# Patient Record
Sex: Male | Born: 1935
Health system: Southern US, Community
[De-identification: ages and names within clinical notes are randomized; demographics above are authoritative.]

## PROBLEM LIST (undated history)

## (undated) DIAGNOSIS — N200 Calculus of kidney: Secondary | ICD-10-CM

## (undated) DIAGNOSIS — K5790 Diverticulosis of intestine, part unspecified, without perforation or abscess without bleeding: Secondary | ICD-10-CM

## (undated) DIAGNOSIS — Z7902 Long term (current) use of antithrombotics/antiplatelets: Secondary | ICD-10-CM

## (undated) DIAGNOSIS — M5030 Other cervical disc degeneration, unspecified cervical region: Secondary | ICD-10-CM

## (undated) DIAGNOSIS — J302 Other seasonal allergic rhinitis: Secondary | ICD-10-CM

## (undated) DIAGNOSIS — E279 Disorder of adrenal gland, unspecified: Secondary | ICD-10-CM

## (undated) DIAGNOSIS — I639 Cerebral infarction, unspecified: Secondary | ICD-10-CM

## (undated) DIAGNOSIS — C61 Malignant neoplasm of prostate: Secondary | ICD-10-CM

## (undated) DIAGNOSIS — M51369 Other intervertebral disc degeneration, lumbar region without mention of lumbar back pain or lower extremity pain: Secondary | ICD-10-CM

## (undated) DIAGNOSIS — I700 Atherosclerosis of aorta: Secondary | ICD-10-CM

## (undated) DIAGNOSIS — I350 Nonrheumatic aortic (valve) stenosis: Secondary | ICD-10-CM

## (undated) DIAGNOSIS — R972 Elevated prostate specific antigen [PSA]: Secondary | ICD-10-CM

## (undated) DIAGNOSIS — M4310 Spondylolisthesis, site unspecified: Secondary | ICD-10-CM

## (undated) DIAGNOSIS — I6781 Acute cerebrovascular insufficiency: Secondary | ICD-10-CM

## (undated) DIAGNOSIS — I651 Occlusion and stenosis of basilar artery: Secondary | ICD-10-CM

## (undated) DIAGNOSIS — I10 Essential (primary) hypertension: Secondary | ICD-10-CM

## (undated) DIAGNOSIS — K4030 Unilateral inguinal hernia, with obstruction, without gangrene, not specified as recurrent: Secondary | ICD-10-CM

## (undated) DIAGNOSIS — I4510 Unspecified right bundle-branch block: Secondary | ICD-10-CM

## (undated) DIAGNOSIS — N281 Cyst of kidney, acquired: Secondary | ICD-10-CM

## (undated) DIAGNOSIS — Z87442 Personal history of urinary calculi: Secondary | ICD-10-CM

## (undated) DIAGNOSIS — R600 Localized edema: Secondary | ICD-10-CM

## (undated) DIAGNOSIS — E785 Hyperlipidemia, unspecified: Secondary | ICD-10-CM

## (undated) DIAGNOSIS — I499 Cardiac arrhythmia, unspecified: Secondary | ICD-10-CM

## (undated) HISTORY — DX: Malignant neoplasm of prostate: C61

## (undated) HISTORY — DX: Elevated prostate specific antigen (PSA): R97.20

## (undated) HISTORY — DX: Hyperlipidemia, unspecified: E78.5

## (undated) HISTORY — PX: INGUINAL HERNIA REPAIR: SHX194

## (undated) HISTORY — PX: OTHER SURGICAL HISTORY: SHX169

## (undated) HISTORY — DX: Localized edema: R60.0

## (undated) HISTORY — DX: Cerebral infarction, unspecified: I63.9

## (undated) HISTORY — DX: Calculus of kidney: N20.0

---

## 1979-05-02 HISTORY — PX: INGUINAL HERNIA REPAIR: SHX194

## 2008-08-01 ENCOUNTER — Ambulatory Visit: Payer: Self-pay | Admitting: Internal Medicine

## 2012-03-31 DIAGNOSIS — Z8673 Personal history of transient ischemic attack (TIA), and cerebral infarction without residual deficits: Secondary | ICD-10-CM | POA: Insufficient documentation

## 2012-04-23 DIAGNOSIS — I6990 Unspecified sequelae of unspecified cerebrovascular disease: Secondary | ICD-10-CM | POA: Insufficient documentation

## 2012-04-24 ENCOUNTER — Inpatient Hospital Stay: Payer: Self-pay | Admitting: Internal Medicine

## 2012-04-24 DIAGNOSIS — I63543 Cerebral infarction due to unspecified occlusion or stenosis of bilateral cerebellar arteries: Secondary | ICD-10-CM

## 2012-04-24 HISTORY — DX: Cerebral infarction due to unspecified occlusion or stenosis of bilateral cerebellar arteries: I63.543

## 2012-04-24 LAB — COMPREHENSIVE METABOLIC PANEL
Albumin: 3.6 g/dL (ref 3.4–5.0)
Alkaline Phosphatase: 90 U/L (ref 50–136)
BUN: 16 mg/dL (ref 7–18)
Bilirubin,Total: 0.5 mg/dL (ref 0.2–1.0)
Co2: 26 mmol/L (ref 21–32)
Creatinine: 0.81 mg/dL (ref 0.60–1.30)
EGFR (Non-African Amer.): >60
Glucose: 108 mg/dL — ABNORMAL HIGH (ref 65–99)
SGOT(AST): 24 U/L (ref 15–37)
SGPT (ALT): 24 U/L (ref 12–78)
Total Protein: 7.4 g/dL (ref 6.4–8.2)

## 2012-04-24 LAB — URINALYSIS, COMPLETE
Bilirubin,UR: NEGATIVE
Glucose,UR: NEGATIVE mg/dL (ref 0–75)
Hyaline Cast: 1
Nitrite: NEGATIVE
Ph: 6 (ref 4.5–8.0)
Protein: NEGATIVE
RBC,UR: <1 /HPF (ref 0–5)

## 2012-04-24 LAB — CBC
HGB: 16.2 g/dL (ref 13.0–18.0)
MCV: 96 fL (ref 80–100)
Platelet: 146 10*3/uL — ABNORMAL LOW (ref 150–440)
RDW: 13.3 % (ref 11.5–14.5)
WBC: 4.7 10*3/uL (ref 3.8–10.6)

## 2012-04-24 LAB — CK TOTAL AND CKMB (NOT AT ARMC): CK, Total: 60 U/L (ref 35–232)

## 2012-04-24 LAB — TROPONIN I: Troponin-I: <0.02 ng/mL

## 2012-04-25 LAB — CBC WITH DIFFERENTIAL/PLATELET
Basophil %: 0.4 %
Eosinophil #: 0.1 10*3/uL (ref 0.0–0.7)
Eosinophil %: 1.4 %
Lymphocyte %: 36.2 %
MCH: 32.6 pg (ref 26.0–34.0)
MCHC: 33.8 g/dL (ref 32.0–36.0)
Monocyte #: 0.4 x10 3/mm (ref 0.2–1.0)
Neutrophil %: 54.9 %
Platelet: 138 10*3/uL — ABNORMAL LOW (ref 150–440)
RBC: 4.68 10*6/uL (ref 4.40–5.90)

## 2012-04-25 LAB — COMPREHENSIVE METABOLIC PANEL
Anion Gap: 9 (ref 7–16)
BUN: 18 mg/dL (ref 7–18)
Bilirubin,Total: 0.5 mg/dL (ref 0.2–1.0)
Chloride: 107 mmol/L (ref 98–107)
Co2: 25 mmol/L (ref 21–32)
EGFR (African American): >60
Glucose: 89 mg/dL (ref 65–99)
Potassium: 4.4 mmol/L (ref 3.5–5.1)
SGOT(AST): 22 U/L (ref 15–37)
Sodium: 141 mmol/L (ref 136–145)
Total Protein: 6.5 g/dL (ref 6.4–8.2)

## 2012-04-25 LAB — LIPID PANEL
HDL Cholesterol: 30 mg/dL — ABNORMAL LOW (ref 40–60)
Triglycerides: 95 mg/dL (ref 0–200)

## 2013-08-12 DIAGNOSIS — E785 Hyperlipidemia, unspecified: Secondary | ICD-10-CM | POA: Diagnosis not present

## 2013-08-12 DIAGNOSIS — R609 Edema, unspecified: Secondary | ICD-10-CM | POA: Diagnosis not present

## 2013-08-12 DIAGNOSIS — Z125 Encounter for screening for malignant neoplasm of prostate: Secondary | ICD-10-CM | POA: Diagnosis not present

## 2013-08-12 DIAGNOSIS — Z0000 Encounter for general adult medical examination without abnormal findings: Secondary | ICD-10-CM | POA: Diagnosis not present

## 2013-08-12 DIAGNOSIS — Z79899 Other long term (current) drug therapy: Secondary | ICD-10-CM | POA: Diagnosis not present

## 2013-08-12 DIAGNOSIS — I6350 Cerebral infarction due to unspecified occlusion or stenosis of unspecified cerebral artery: Secondary | ICD-10-CM | POA: Diagnosis not present

## 2013-08-12 LAB — PSA: PSA: 12.6

## 2013-08-12 LAB — LIPID PANEL
Cholesterol: 102 mg/dL (ref 0–200)
HDL: 41 mg/dL (ref 35–70)
LDL Cholesterol: 48 mg/dL
TRIGLYCERIDES: 63 mg/dL (ref 40–160)

## 2013-08-12 LAB — TSH: TSH: 1.25 u[IU]/mL (ref <=5.90)

## 2013-08-19 DIAGNOSIS — R972 Elevated prostate specific antigen [PSA]: Secondary | ICD-10-CM | POA: Diagnosis not present

## 2013-10-29 DIAGNOSIS — R972 Elevated prostate specific antigen [PSA]: Secondary | ICD-10-CM | POA: Diagnosis not present

## 2013-10-29 DIAGNOSIS — N423 Unspecified dysplasia of prostate: Secondary | ICD-10-CM | POA: Diagnosis not present

## 2013-10-29 DIAGNOSIS — C61 Malignant neoplasm of prostate: Secondary | ICD-10-CM | POA: Diagnosis not present

## 2013-11-05 DIAGNOSIS — C61 Malignant neoplasm of prostate: Secondary | ICD-10-CM | POA: Diagnosis not present

## 2013-11-12 ENCOUNTER — Ambulatory Visit: Payer: Self-pay | Admitting: Radiation Oncology

## 2013-11-12 DIAGNOSIS — C61 Malignant neoplasm of prostate: Secondary | ICD-10-CM | POA: Diagnosis not present

## 2013-11-12 DIAGNOSIS — R948 Abnormal results of function studies of other organs and systems: Secondary | ICD-10-CM | POA: Diagnosis not present

## 2013-11-14 DIAGNOSIS — C61 Malignant neoplasm of prostate: Secondary | ICD-10-CM | POA: Diagnosis not present

## 2013-11-27 DIAGNOSIS — R0989 Other specified symptoms and signs involving the circulatory and respiratory systems: Secondary | ICD-10-CM | POA: Diagnosis not present

## 2013-11-29 ENCOUNTER — Ambulatory Visit: Payer: Self-pay | Admitting: Radiation Oncology

## 2013-12-04 DIAGNOSIS — C61 Malignant neoplasm of prostate: Secondary | ICD-10-CM | POA: Diagnosis not present

## 2013-12-04 DIAGNOSIS — R300 Dysuria: Secondary | ICD-10-CM | POA: Diagnosis not present

## 2014-01-02 DIAGNOSIS — C61 Malignant neoplasm of prostate: Secondary | ICD-10-CM | POA: Diagnosis not present

## 2014-01-06 DIAGNOSIS — C61 Malignant neoplasm of prostate: Secondary | ICD-10-CM | POA: Diagnosis not present

## 2014-01-06 DIAGNOSIS — R300 Dysuria: Secondary | ICD-10-CM | POA: Diagnosis not present

## 2014-01-12 ENCOUNTER — Ambulatory Visit: Payer: Self-pay | Admitting: Radiation Oncology

## 2014-01-12 DIAGNOSIS — Z510 Encounter for antineoplastic radiation therapy: Secondary | ICD-10-CM | POA: Diagnosis not present

## 2014-01-12 DIAGNOSIS — C61 Malignant neoplasm of prostate: Secondary | ICD-10-CM | POA: Diagnosis not present

## 2014-01-13 DIAGNOSIS — C61 Malignant neoplasm of prostate: Secondary | ICD-10-CM | POA: Diagnosis not present

## 2014-01-16 DIAGNOSIS — C61 Malignant neoplasm of prostate: Secondary | ICD-10-CM | POA: Diagnosis not present

## 2014-01-21 DIAGNOSIS — C61 Malignant neoplasm of prostate: Secondary | ICD-10-CM | POA: Diagnosis not present

## 2014-01-22 DIAGNOSIS — C61 Malignant neoplasm of prostate: Secondary | ICD-10-CM | POA: Diagnosis not present

## 2014-01-26 DIAGNOSIS — C61 Malignant neoplasm of prostate: Secondary | ICD-10-CM | POA: Diagnosis not present

## 2014-01-27 DIAGNOSIS — C61 Malignant neoplasm of prostate: Secondary | ICD-10-CM | POA: Diagnosis not present

## 2014-01-28 DIAGNOSIS — C61 Malignant neoplasm of prostate: Secondary | ICD-10-CM | POA: Diagnosis not present

## 2014-01-29 ENCOUNTER — Ambulatory Visit: Payer: Self-pay | Admitting: Radiation Oncology

## 2014-01-29 DIAGNOSIS — Z510 Encounter for antineoplastic radiation therapy: Secondary | ICD-10-CM | POA: Diagnosis not present

## 2014-01-29 DIAGNOSIS — C61 Malignant neoplasm of prostate: Secondary | ICD-10-CM | POA: Diagnosis not present

## 2014-01-30 DIAGNOSIS — C61 Malignant neoplasm of prostate: Secondary | ICD-10-CM | POA: Diagnosis not present

## 2014-02-02 DIAGNOSIS — C61 Malignant neoplasm of prostate: Secondary | ICD-10-CM | POA: Diagnosis not present

## 2014-02-03 DIAGNOSIS — C61 Malignant neoplasm of prostate: Secondary | ICD-10-CM | POA: Diagnosis not present

## 2014-02-04 DIAGNOSIS — C61 Malignant neoplasm of prostate: Secondary | ICD-10-CM | POA: Diagnosis not present

## 2014-02-04 LAB — CBC CANCER CENTER
BASOS PCT: 0.5 %
Basophil #: 0 x10 3/mm (ref 0.0–0.1)
Eosinophil #: 0.1 x10 3/mm (ref 0.0–0.7)
Eosinophil %: 1.5 %
HCT: 42.8 % (ref 40.0–52.0)
HGB: 14.3 g/dL (ref 13.0–18.0)
LYMPHS PCT: 32.9 %
Lymphocyte #: 1.3 x10 3/mm (ref 1.0–3.6)
MCH: 32.5 pg (ref 26.0–34.0)
MCHC: 33.3 g/dL (ref 32.0–36.0)
MCV: 98 fL (ref 80–100)
Monocyte #: 0.3 x10 3/mm (ref 0.2–1.0)
Monocyte %: 8.7 %
NEUTROS ABS: 2.2 x10 3/mm (ref 1.4–6.5)
NEUTROS PCT: 56.4 %
Platelet: 136 x10 3/mm — ABNORMAL LOW (ref 150–440)
RBC: 4.38 10*6/uL — AB (ref 4.40–5.90)
RDW: 12.9 % (ref 11.5–14.5)
WBC: 3.9 x10 3/mm (ref 3.8–10.6)

## 2014-02-05 DIAGNOSIS — C61 Malignant neoplasm of prostate: Secondary | ICD-10-CM | POA: Diagnosis not present

## 2014-02-09 DIAGNOSIS — C61 Malignant neoplasm of prostate: Secondary | ICD-10-CM | POA: Diagnosis not present

## 2014-02-10 DIAGNOSIS — C61 Malignant neoplasm of prostate: Secondary | ICD-10-CM | POA: Diagnosis not present

## 2014-02-11 DIAGNOSIS — C61 Malignant neoplasm of prostate: Secondary | ICD-10-CM | POA: Diagnosis not present

## 2014-02-12 DIAGNOSIS — C61 Malignant neoplasm of prostate: Secondary | ICD-10-CM | POA: Diagnosis not present

## 2014-02-13 DIAGNOSIS — C61 Malignant neoplasm of prostate: Secondary | ICD-10-CM | POA: Diagnosis not present

## 2014-02-17 DIAGNOSIS — C61 Malignant neoplasm of prostate: Secondary | ICD-10-CM | POA: Diagnosis not present

## 2014-02-18 DIAGNOSIS — C61 Malignant neoplasm of prostate: Secondary | ICD-10-CM | POA: Diagnosis not present

## 2014-02-18 LAB — CBC CANCER CENTER
BASOS ABS: 0 x10 3/mm (ref 0.0–0.1)
Basophil %: 0.5 %
Eosinophil #: 0.1 x10 3/mm (ref 0.0–0.7)
Eosinophil %: 2.1 %
HCT: 42.7 % (ref 40.0–52.0)
HGB: 14.1 g/dL (ref 13.0–18.0)
Lymphocyte #: 0.8 x10 3/mm — ABNORMAL LOW (ref 1.0–3.6)
Lymphocyte %: 26.8 %
MCH: 32.7 pg (ref 26.0–34.0)
MCHC: 33.1 g/dL (ref 32.0–36.0)
MCV: 99 fL (ref 80–100)
Monocyte #: 0.3 x10 3/mm (ref 0.2–1.0)
Monocyte %: 10.4 %
NEUTROS ABS: 1.8 x10 3/mm (ref 1.4–6.5)
NEUTROS PCT: 60.2 %
PLATELETS: 127 x10 3/mm — AB (ref 150–440)
RBC: 4.32 10*6/uL — ABNORMAL LOW (ref 4.40–5.90)
RDW: 13.3 % (ref 11.5–14.5)
WBC: 3 x10 3/mm — ABNORMAL LOW (ref 3.8–10.6)

## 2014-02-19 DIAGNOSIS — C61 Malignant neoplasm of prostate: Secondary | ICD-10-CM | POA: Diagnosis not present

## 2014-02-20 DIAGNOSIS — C61 Malignant neoplasm of prostate: Secondary | ICD-10-CM | POA: Diagnosis not present

## 2014-02-23 DIAGNOSIS — C61 Malignant neoplasm of prostate: Secondary | ICD-10-CM | POA: Diagnosis not present

## 2014-02-24 DIAGNOSIS — C61 Malignant neoplasm of prostate: Secondary | ICD-10-CM | POA: Diagnosis not present

## 2014-02-25 DIAGNOSIS — C61 Malignant neoplasm of prostate: Secondary | ICD-10-CM | POA: Diagnosis not present

## 2014-02-26 DIAGNOSIS — C61 Malignant neoplasm of prostate: Secondary | ICD-10-CM | POA: Diagnosis not present

## 2014-02-27 DIAGNOSIS — C61 Malignant neoplasm of prostate: Secondary | ICD-10-CM | POA: Diagnosis not present

## 2014-03-01 ENCOUNTER — Ambulatory Visit: Payer: Self-pay | Admitting: Radiation Oncology

## 2014-03-01 DIAGNOSIS — C61 Malignant neoplasm of prostate: Secondary | ICD-10-CM | POA: Diagnosis not present

## 2014-03-01 DIAGNOSIS — Z510 Encounter for antineoplastic radiation therapy: Secondary | ICD-10-CM | POA: Diagnosis not present

## 2014-03-02 DIAGNOSIS — C61 Malignant neoplasm of prostate: Secondary | ICD-10-CM | POA: Diagnosis not present

## 2014-03-03 DIAGNOSIS — C61 Malignant neoplasm of prostate: Secondary | ICD-10-CM | POA: Diagnosis not present

## 2014-03-04 DIAGNOSIS — C61 Malignant neoplasm of prostate: Secondary | ICD-10-CM | POA: Diagnosis not present

## 2014-03-04 LAB — CBC CANCER CENTER
BASOS ABS: 0 x10 3/mm (ref 0.0–0.1)
BASOS PCT: 0.6 %
EOS ABS: 0.1 x10 3/mm (ref 0.0–0.7)
Eosinophil %: 2.7 %
HCT: 42.5 % (ref 40.0–52.0)
HGB: 13.8 g/dL (ref 13.0–18.0)
Lymphocyte #: 0.6 x10 3/mm — ABNORMAL LOW (ref 1.0–3.6)
Lymphocyte %: 19.2 %
MCH: 32.6 pg (ref 26.0–34.0)
MCHC: 32.4 g/dL (ref 32.0–36.0)
MCV: 101 fL — ABNORMAL HIGH (ref 80–100)
MONO ABS: 0.3 x10 3/mm (ref 0.2–1.0)
MONOS PCT: 10.2 %
NEUTROS ABS: 2.3 x10 3/mm (ref 1.4–6.5)
NEUTROS PCT: 67.3 %
PLATELETS: 146 x10 3/mm — AB (ref 150–440)
RBC: 4.23 10*6/uL — ABNORMAL LOW (ref 4.40–5.90)
RDW: 13.7 % (ref 11.5–14.5)
WBC: 3.4 x10 3/mm — ABNORMAL LOW (ref 3.8–10.6)

## 2014-03-06 DIAGNOSIS — C61 Malignant neoplasm of prostate: Secondary | ICD-10-CM | POA: Diagnosis not present

## 2014-03-09 DIAGNOSIS — C61 Malignant neoplasm of prostate: Secondary | ICD-10-CM | POA: Diagnosis not present

## 2014-03-10 DIAGNOSIS — C61 Malignant neoplasm of prostate: Secondary | ICD-10-CM | POA: Diagnosis not present

## 2014-03-11 DIAGNOSIS — C61 Malignant neoplasm of prostate: Secondary | ICD-10-CM | POA: Diagnosis not present

## 2014-03-12 DIAGNOSIS — C61 Malignant neoplasm of prostate: Secondary | ICD-10-CM | POA: Diagnosis not present

## 2014-03-16 DIAGNOSIS — C61 Malignant neoplasm of prostate: Secondary | ICD-10-CM | POA: Diagnosis not present

## 2014-03-17 DIAGNOSIS — C61 Malignant neoplasm of prostate: Secondary | ICD-10-CM | POA: Diagnosis not present

## 2014-03-18 DIAGNOSIS — C61 Malignant neoplasm of prostate: Secondary | ICD-10-CM | POA: Diagnosis not present

## 2014-03-18 LAB — CBC CANCER CENTER
BASOS ABS: 0 x10 3/mm (ref 0.0–0.1)
BASOS PCT: 0.4 %
EOS PCT: 2.3 %
Eosinophil #: 0.1 x10 3/mm (ref 0.0–0.7)
HCT: 41.9 % (ref 40.0–52.0)
HGB: 13.8 g/dL (ref 13.0–18.0)
LYMPHS ABS: 0.5 x10 3/mm — AB (ref 1.0–3.6)
LYMPHS PCT: 14.9 %
MCH: 32.6 pg (ref 26.0–34.0)
MCHC: 33 g/dL (ref 32.0–36.0)
MCV: 99 fL (ref 80–100)
MONO ABS: 0.3 x10 3/mm (ref 0.2–1.0)
Monocyte %: 9.6 %
NEUTROS ABS: 2.6 x10 3/mm (ref 1.4–6.5)
NEUTROS PCT: 72.8 %
PLATELETS: 163 x10 3/mm (ref 150–440)
RBC: 4.24 10*6/uL — ABNORMAL LOW (ref 4.40–5.90)
RDW: 13.5 % (ref 11.5–14.5)
WBC: 3.6 x10 3/mm — ABNORMAL LOW (ref 3.8–10.6)

## 2014-03-19 DIAGNOSIS — C61 Malignant neoplasm of prostate: Secondary | ICD-10-CM | POA: Diagnosis not present

## 2014-03-24 DIAGNOSIS — C61 Malignant neoplasm of prostate: Secondary | ICD-10-CM | POA: Diagnosis not present

## 2014-03-25 DIAGNOSIS — C61 Malignant neoplasm of prostate: Secondary | ICD-10-CM | POA: Diagnosis not present

## 2014-03-31 ENCOUNTER — Ambulatory Visit: Payer: Self-pay | Admitting: Radiation Oncology

## 2014-04-14 DIAGNOSIS — C61 Malignant neoplasm of prostate: Secondary | ICD-10-CM | POA: Diagnosis not present

## 2014-05-06 ENCOUNTER — Ambulatory Visit: Payer: Self-pay | Admitting: Radiation Oncology

## 2014-08-11 DIAGNOSIS — C61 Malignant neoplasm of prostate: Secondary | ICD-10-CM | POA: Diagnosis not present

## 2014-08-18 NOTE — Consult Note (Signed)
Patient admitted with posterior circulation stroke.  MRI/MRA reviewed.  Cervical vessels are normal.  Appears to have a basilar artery stenosis on MRA.  This would represent an intracranial lesion, and without neurosurgery here we do not treat these at [facility].  He feels basically at his baseline.  Would recommend Plavix and a statin agent be used.  Would recommend arranging Neurosurgical follow up tomorrow and if this is arranged, he may be able to be discharged home.  This lesion may be considered for intervention after he recovers from the acute stroke, but would need a neurosurgery evaluation for this.    Electronic Signatures: Algernon Huxley (MD)  (Signed on 26-Dec-13 19:16)  Authored  Last Updated: 26-Dec-13 19:16 by Algernon Huxley (MD)

## 2014-08-18 NOTE — H&P (Signed)
PATIENT NAME:  [name], [name] MR#:  [id number] DATE OF BIRTH:  1936-02-15  DATE OF ADMISSION:  04/24/2012  REFERRING PHYSICIAN: Ferman Hamming, MD   PRIMARY CARE PHYSICIAN: None   CHIEF COMPLAINT: Slurred speech, altered mental status, unsteady gait.   HISTORY OF PRESENT ILLNESS: This is a 79 year old male without significant past medical history as he has not been seeing any physician for the last 40 years. The patient presents for various complaints.  He reports he woke up on the floor next to his bed where he reports he has been complaining for headache for the last few days. As well, he reports he had unsteady gait and slurred speech. His wife is at bedside and reports his symptoms resolved in an hour. She reports they went to visit family members where there she noticed him to have another episode or slurred speech with left facial droop, where as well he had an episode of altered mental status and he fell to his back where he fell on the couch on the cushion where he did not hurt himself. There is no report of loss of consciousness. As well, the patient reports he had an episode of urinary incontinence this a.m. As well, the reports he had an episode of stool incontinence this afternoon when fell on the couch. Upon presentation to the ED, the patient reports his symptoms totally resolved, he has no unsteady gait, and he would walk on a couple of occasions without any dizziness, lightheadedness, or unsteady gait. The patient speaks appropriately without any slurred speech. The patient reports he has history of smoking 1/2 pack a day for the last 64 years. He denies any history of hypertension or high cholesterol. As well, he reports he has never seen a physician to have this work-up done. Upon presentation, the patient was hypertensive with systolic blood pressure 045, which resolved after the patient was given p.o. clonidine. The patient denies any dizziness, lightheadedness, any chest pain, any  shortness of breath, any coffee-ground emesis, any diarrhea, constipation, urinary burning sensation or polyuria. The patient had a CT of the brain done which did show evidence of old infarction in the right cerebral area, as well age indeterminate infarct in the left thalamic area. The patient's EKG did show normal sinus rhythm.   PAST MEDICAL HISTORY: As per patient none, but he has not been seeing a physician for 40 years.   PAST SURGICAL HISTORY: History of inguinal hernia repair.    HOME MEDICATIONS:  None.   ALLERGIES: No known drug allergies.   SOCIAL HISTORY: The patient used to work in [employer]/rest[employer]. He smokes 1/2 pack of cigarettes since he was 79 years old. No history of alcohol or substance abuse.   FAMILY HISTORY: Denies any family history of cardiac disease.   REVIEW OF SYSTEMS: Denies fever. Complains of fatigue, generalized weakness.  EYES: Denies blurry vision, double vision or pain.  ENT: Denies tinnitus, ear pain, hearing loss, epistaxis, snoring or discharge.  RESPIRATORY: Denies any cough, wheezing, hemoptysis, dyspnea, chronic obstructive pulmonary disease.  CARDIOVASCULAR: Denies chest pain, orthopnea, edema, arrhythmia, palpitations.  GASTROINTESTINAL: Denies abdominal pain, hematemesis, melena, GERD, jaundice. Has complaints of nausea and one episode of vomiting.  GENITOURINARY: Denies dysuria, hematuria. Had one episode of urinary incontinence.  ENDOCRINE: Denies polyuria, polydipsia, heat or cold intolerance.  HEMATOLOGY: Denies anemia, easy bruising, bleeding diathesis.  INTEGUMENTARY: Denies acne, rash, or skin lesions.  MUSCULOSKELETAL: Denies any neck pain, shoulder pain, knee pain, arthritis, cramps, swelling, or gout.  NEUROLOGICAL: Denies any history of seizures tremors and has tremors, vertigo. Has complaints of ataxia, dysarthria. Denies any focal numbness or tingling or any focal deficits.  PSYCHIATRIC: Denies any anxiety, insomnia, schizophrenia,  nervousness, substance or alcohol abuse.   PHYSICAL EXAMINATION: VITAL SIGNS: Temperature 98.4, pulse 54, respiratory rate 14, blood pressure 164/89, saturating 96% on room air.  GENERAL: Elderly male, well-nourished, looks comfortable in bed, in no apparent distress.  HEENT: Head atraumatic, normocephalic. Pupils are equal, reactive to light. Pink conjunctivae. Anicteric sclerae. Moist oral mucosa.  NECK: Supple. No thyromegaly. No JVD.  CHEST: Good air entry bilaterally. No wheezing, rales, rhonchi.  CARDIOVASCULAR: S1, S2 heard. No rubs, murmur, or gallops. No carotid bruits.  ABDOMEN: Obese, soft, nontender, nondistended. Bowel sounds present.  EXTREMITIES: No edema. No clubbing. No cyanosis.  PSYCHIATRIC: Appropriate affect. Awake, alert x 3. Intact judgment and insight.  NEUROLOGIC: Cranial nerves grossly intact. Motor 5 out of 5. Sensation symmetrical. Good reflexes bilaterally. Negative Romberg sign. Good gait.   PERTINENT LABORATORY, DIAGNOSTIC AND RADIOLOGICAL DATA:   Glucose 108, BUN 16, creatinine 0.81, sodium 142, potassium 3.8, chloride 106, CO2 26. Troponin less than 0.02. White blood cells 4.7, hemoglobin 16.2, hematocrit 48.2, platelets 146. Urinalysis negative.   CT brain without contrast is showing age indeterminant left thalamic lacunar infarct and old right cerebral small infarct, otherwise no acute intracranial process.   ASSESSMENT AND PLAN: This is a 79 year old male who presents with slurred speech and unsteady gait for the last two days. Currently symptoms have resolved. He  has no PCP. History of smoking, possible hypertension as he was hypertensive upon presentation.   1. CVA versus transient ischemic attack: The patient had evidence of left thalamic infarct, age undetermined, so he will be admitted, will have CVA work-up done. The patient already was given 324 of aspirin in the ED. We will check MRI of the brain. We will have the patient admitted with telemonitor, as  well as check carotid Dopplers, and a 2D echo and will check lipid panel. Given the fact he has stool and urinary incontinence, we will check his EEG as well to report any seizure activity. We will consult physical therapy and neurology service. The patient will be started on deep vein thrombosis prophylaxis as well.  2. Hypertension: The patient had uncontrolled blood pressure upon presentation, unknown if he has history of hypertension or not as he has not been checking his blood pressure, has no PCP. We will avoid rapid correction of his blood pressure especially with possible cerebrovascular accident. I will target systolic blood pressure between 160 to 180 for the next 48 hours. At this point, we will have him on p.r.n. hydralazine.  3. Tobacco abuse: The patient was counseled, and at this point he is refusing any Nicoderm patches.  4. Deep vein thrombosis prophylaxis:  Subcutaneous heparin.   CODE STATUS:  FULL CODE.          TOTAL TIME SPENT ON ADMISSION AND PATIENT CARE: 55 minutes.   ____________________________ Albertine Patricia, MD dse:cb D: 04/24/2012 14:39:27 ET  T: 04/24/2012 16:15:41 ET  JOB#: [id number]  cc: Albertine Patricia, MD, <Dictator> Balbina Depace Graciela Husbands MD ELECTRONICALLY SIGNED 04/27/2012 0:31

## 2014-08-21 NOTE — Discharge Summary (Signed)
PATIENT NAMEHURSHELL, [name] MR#:  [id number] DATE OF BIRTH:  24-Sep-1935  DATE OF ADMISSION:  04/24/2012 DATE OF DISCHARGE:  04/26/2012  DISCHARGE DIAGNOSES: 1.  Bilateral cerebellar acute cerebrovascular accident.  2.  Basilar stenosis.  3.  Elevated blood pressure without diagnosis of hypertension.  4.  Hyperlipidemia.  5.  Tobacco abuse.   CONSULTS:  Neurology and vascular surgery.   IMAGING STUDIES DONE: Include: 1.  CT of the head without contrast which showed no acute intracranial process.  2.  MRI of the brain showed punctate areas of acute ischemia noted in cerebellar hemispheres bilaterally.  3.  MRA of the brain without contrast showed high-grade mild basilar artery stenosis.  4.  Ultrasound carotid Doppler showed no significant carotid stenosis.  5.  A 2-D echocardiogram showed ejection fraction greater than 55% with no other significant abnormalities.   ADMITTING HISTORY AND PHYSICAL: Please see detailed H and P dictated on 04/24/2012. In brief, a 79 year old male patient with no significant past medical history who presented to the hospital complaining of gait abnormalities and left facial droop. The patient was admitted for CVA/TIA.    HOSPITAL COURSE: The patient was admitted to the tele floor. The patient had an MRI of the brain which showed bilateral cerebellar CVA, acute. The patient was seen by neurology, who suggested no further work-up. The patient also had an MRA of the brain which showed high-grade stenosis of the basilar artery. Vascular surgery was consulted for the same, who suggested followup with neurosurgery at tertiary care center as an outpatient. The patient was started on aspirin, Plavix, statin; is being discharged home with physical therapy and occupational therapy as outpatient. His symptoms are close to being resolved. He is walking independently at this time.   The patient also had elevated blood pressure secondary to the acute stroke, which is trending  down to a normal range. No blood pressure medications needed to be used.   The patient has been set up for appointments at this time with [facility] neurosurgery on 04/29/2012 and he has been set up with [facility] in [place] with Dr. Fredric Dine January 2nd, 10:20 a.m. appointment for followup.    DISCHARGE MEDICATIONS: 1.  Aspirin 81 mg oral once a day.  2.  Plavix 75 mg oral once a day.  3.  Atorvastatin 80 mg oral once a day.   DISCHARGE INSTRUCTIONS:  Low sodium,  low fat diet. Activity as tolerated. Continue physical therapy and occupational therapy as outpatient. The patient has been counseled for greater than 5 minutes during the hospital stay to quit smoking, including on the day of discharge. Follow up with primary care physician and neurosurgery as appointments obtained.   TIME SPENT: Time spent on the day of discharge in discharge activity was 40 minutes.    ____________________________ Jimmy Alf Adeja Sarratt, MD srs:cs D: 04/26/2012 13:23:00 ET T: 04/28/2012 14:15:45 ET JOB#: [id number]  cc: Jimmy Heimlich R. Lawayne Hartig, MD, <Dictator> [facility] in [place], Dr. Soyla Murphy MD ELECTRONICALLY SIGNED 05/09/2012 15:27

## 2014-08-22 NOTE — Consult Note (Signed)
Reason for Visit: This 79 year old Male patient presents to the clinic for initial evaluation of  prostate cancer .   Referred by Dr. Elnoria Howard.  Diagnosis:  Chief Complaint/Diagnosis   79 year old male presenting with a PSA in the 12 range with a Gleason 7 (4+3) clinical stage at least to be (T2 C. NX M0) for evaluation regarding definitive treatment.  Pathology Report P.athology report reviewed   Imaging Report bone scan ordered   Referral Report clinical notes reviewed   Planned Treatment Regimen possible IM RT radiation therapy with androgen deprivation therapy   HPI   patient is a 29 rolled male who had a PSA tested for the first time by his PMD and was in the 12 range. He was seen by urology and underwent transrectal ultrasound-guided biopsy showing 8 of 12 cores, in both lobes positive for mostly Gleason 7 (4+3) adenocarcinoma. Patient is asymptomatic specifically denies urinary frequency urgency or nocturia. Does have erectile dysfunction although does occasionally wake with an erection in the morning. Treatment options have been discussed with the patient. he is now seen for evaluation by radiation oncology. He is having no bone pain at this time.  Past Hx:    Prostate Cancer:    CVA/Stroke:    Hyperlipidemia:    hernia repair 1981:   Past, Family and Social History:  Past Medical History positive   Cardiovascular hyperlipidemia   Neurological/Psychiatric CVA   Past Surgical History herniorrhaphy repair   Family History positive   Family History Comments family history of Parkinson's disease, esophageal cancer brother with leukemia and brother with prostate cancer   Social History positive   Social History Comments 50-pack-year smoking history quit smoking 2013 no EtOH use history   Additional Past Medical and Surgical History accompanied by his wife today   Allergies:   No Known Allergies:   Home Meds:  Home Medications: Medication Instructions Status   atorvastatin 80 mg oral tablet 1 tab(s) orally once a day (at bedtime) Active  Plavix 75 mg oral tablet 1 tab(s) orally once a day Active  Aspir 81 oral delayed release tablet 1 tab(s) orally once a day Active   Review of Systems:  General negative   Performance Status (ECOG) 0   Skin negative   Breast negative   Ophthalmologic negative   ENMT negative   Respiratory and Thorax negative   Cardiovascular negative   Gastrointestinal negative   Genitourinary see HPI   Musculoskeletal negative   Neurological negative   Psychiatric negative   Hematology/Lymphatics negative   Endocrine negative   Allergic/Immunologic negative   Review of Systems   denies any weight loss, fatigue, weakness, fever, chills or night sweats. Patient denies any loss of vision, blurred vision. Patient denies any ringing  of the ears or hearing loss. No irregular heartbeat. Patient denies heart murmur or history of fainting. Patient denies any chest pain or pain radiating to her upper extremities. Patient denies any shortness of breath, difficulty breathing at night, cough or hemoptysis. Patient denies any swelling in the lower legs. Patient denies any nausea vomiting, vomiting of blood, or coffee ground material in the vomitus. Patient denies any stomach pain. Patient states has had normal bowel movements no significant constipation or diarrhea. Patient denies any dysuria, hematuria or significant nocturia. Patient denies any problems walking, swelling in the joints or loss of balance. Patient denies any skin changes, loss of hair or loss of weight. Patient denies any excessive worrying or anxiety or significant depression. Patient  denies any problems with insomnia. Patient denies excessive thirst, polyuria, polydipsia. Patient denies any swollen glands, patient denies easy bruising or easy bleeding. Patient denies any recent infections, allergies or URI. Patient "s visual fields have not changed  significantly in recent time.   Nursing Notes:  Nursing Vital Signs and Chemo Nursing Nursing Notes: *CC Vital Signs Flowsheet:   15-Jul-15 08:16  Pulse Pulse 55  Respirations Respirations 20  SBP SBP 162  DBP DBP 96  Pain Scale (0-10)  0  Current Weight (kg) (kg) 87.4   Physical Exam:  General/Skin/HEENT:  General normal   Skin normal   Eyes normal   ENMT normal   Head and Neck normal   Additional PE well-developed well-nourished male in NAD. Lungs are clear to A&P cardiac examination shows regular rate and rhythm. On rectal exam rectal sphincter tone is good. Prostate is firm throughout with some obscuring of the left sulcus. Seminal vesicle regions not identified. No other rectal abnormality is identified. Prostate volume is about 60 ml.   Breasts/Resp/CV/GI/GU:  Respiratory and Thorax normal   Cardiovascular normal   Gastrointestinal normal   Genitourinary normal   MS/Neuro/Psych/Lymph:  Musculoskeletal normal   Neurological normal   Lymphatics normal   Other Results:  Radiology Results: LabUnknown:    26-Dec-13 13:59, MRA Brain Without Contrast  PACS Image   MRI:  MRA Brain Without Contrast   REASON FOR EXAM:    Bilateral cerebellar CVA  COMMENTS:       PROCEDURE: MR  - MRA BRAIN WO CONTRAST  - Apr 25 2012  1:59PM     RESULT: History: CVA.    Findings: Standard MRA obtained. High-grade basilar artery stenosis is   present. Stenosis is severe. This most likely accounts for it is a   identified foci of ischemia in the cerebellar hemispheres. Anterior   circulation widely patent.    IMPRESSION:  High-grade near occlusive mid basilar artery stenosis. Neuro   interventional consultation should be considered.    Verified By: Osa Craver, M.D., MD   Relevent Results:   Relevant Scans and Labs brain MRI reviewed. Bone scan ordered   Assessment and Plan: Impression:   locally advanced intermediate risk prostate cancer based on Gleason  score and PSA in 78 rolled malewith at least stage IIB adenocarcinoma of the prostate Plan:   I have run his presenting indicators through the [facility] prediction nomogram. It shows a possibility extracapsular extension at 82% and lymph node involvement at 34% with 29% involvement of the seminal vesicle. Based on the nomogram I believe he is at high risk for pelvic lymph node involvement and would opt to treat both his prostate and pelvic lymph nodes. I will plan on delivering at 8000 cGy to his prostate treating his pelvic lymph nodes 5400 cGy using IM RT does painting technique. Patient is not interested in seed implant or radical prostatectomy. I have ordered a bone scan based on his PSA being over 10. I've asked Dr. Elnoria Howard to place fiduciary markers of his prostate for daily image guided treatment. Risks and benefits of treatment including increased urinary frequency urgency, diarrhea, fatigue, alteration blood counts all were explained in detail to the patient and his wife. Both seem to compliment her treatment plan well. I set him up for simulation shortly after placement of his fiduciary markers. Patient will also be a candidate for androgen deprivation therapy for at least 16 months that is being provided by Dr. Guinevere Ferrari office.  I would  like to take this opportunity for allowing me to participate in the care of your patient..  CC Referral:  cc: Dr. Elnoria Howard, Dr. Coral Spikes   Electronic Signatures: Baruch Gouty, Roda Shutters (MD)  (Signed 15-Jul-15 09:44)  Authored: HPI, Diagnosis, Past Hx, PFSH, Allergies, Home Meds, ROS, Nursing Notes, Physical Exam, Other Results, Relevent Results, Encounter Assessment and Plan, CC Referring Physician   Last Updated: 15-Jul-15 09:44 by Armstead Peaks (MD)

## 2014-09-03 ENCOUNTER — Encounter: Payer: Self-pay | Admitting: Internal Medicine

## 2014-09-03 DIAGNOSIS — E785 Hyperlipidemia, unspecified: Secondary | ICD-10-CM | POA: Insufficient documentation

## 2014-09-03 DIAGNOSIS — Z125 Encounter for screening for malignant neoplasm of prostate: Secondary | ICD-10-CM | POA: Insufficient documentation

## 2014-09-03 DIAGNOSIS — R600 Localized edema: Secondary | ICD-10-CM | POA: Insufficient documentation

## 2014-10-29 ENCOUNTER — Encounter: Payer: Self-pay | Admitting: Radiation Oncology

## 2014-10-29 ENCOUNTER — Other Ambulatory Visit: Payer: Self-pay | Admitting: *Deleted

## 2014-10-29 ENCOUNTER — Ambulatory Visit
Admission: RE | Admit: 2014-10-29 | Discharge: 2014-10-29 | Disposition: A | Discharge status: Home / Self Care | Payer: Medicare Other | Source: Ambulatory Visit | Attending: Radiation Oncology | Admitting: Radiation Oncology

## 2014-10-29 ENCOUNTER — Inpatient Hospital Stay: Payer: Medicare Other | Attending: Radiation Oncology

## 2014-10-29 VITALS — BP 166/81 | HR 51 | Temp 95.8°F | Resp 20 | Wt 190.7 lb

## 2014-10-29 DIAGNOSIS — C61 Malignant neoplasm of prostate: Secondary | ICD-10-CM | POA: Diagnosis not present

## 2014-10-29 LAB — PSA: PSA: 0.09 ng/mL (ref 0.00–4.00)

## 2014-10-29 NOTE — Progress Notes (Signed)
Radiation Oncology Follow up Note  Name: [name]   Date:   10/29/2014 MRN:  [id number] DOB: 07/04/1935    This 79 y.o. male presents to the clinic today for follow-up for prostate cancer Gleason 7 (4+3) a T2c lesion presenting the PSA of 12 status post. Image guided radiation therapy to both his prostate and pelvic nodes now 6 months out  REFERRING PROVIDER: No ref. provider found  HPI: Patient is a 79 year old male now 6 months out having completed IM RT treatment planning and delivery to his prostate and pelvic nodes for Gleason 7 (4+3) adenocarcinoma presenting with a PSA of 12. He is seen today in routine follow-up and is doing well. Specifically denies diarrhea dysuria or any other GI/GU complaints. Had a PSA several months ago which was 0.2. I have run another PSA level on him today..  COMPLICATIONS OF TREATMENT: none  FOLLOW UP COMPLIANCE: keeps appointments   PHYSICAL EXAM:  BP 166/81 mmHg  Pulse 51  Temp(Src) 95.8 F (35.4 C)  Resp 20  Wt 190 lb 11.2 oz (86.5 kg) On rectal exam rectal sphincter tone is good. Prostate is smooth contracted without evidence of nodularity or mass. Sulcus is preserved bilaterally. No discrete nodularity is identified. No other rectal abnormalities are noted. Well-developed well-nourished patient in NAD. HEENT reveals PERLA, EOMI, discs not visualized.  Oral cavity is clear. No oral mucosal lesions are identified. Neck is clear without evidence of cervical or supraclavicular adenopathy. Lungs are clear to A&P. Cardiac examination is essentially unremarkable with regular rate and rhythm without murmur rub or thrill. Abdomen is benign with no organomegaly or masses noted. Motor sensory and DTR levels are equal and symmetric in the upper and lower extremities. Cranial nerves II through XII are grossly intact. Proprioception is intact. No peripheral adenopathy or edema is identified. No motor or sensory levels are noted. Crude visual fields are within  normal range.   RADIOLOGY RESULTS: No new radiology reports to review  PLAN: At the present time from somatic standpoint he is doing well now out 6 months after completing IM RT radiation therapy for prostate cancer. I am please was overall progress. I have run a PSA level on him today and will report that separately. Otherwise I've asked to see him back in 6 months for follow-up. Patient is to call sooner with any concerns.  I would like to take this opportunity for allowing me to participate in the care of your patient.[name]., MD

## 2014-10-31 ENCOUNTER — Other Ambulatory Visit: Payer: Self-pay | Admitting: Internal Medicine

## 2014-11-23 ENCOUNTER — Encounter: Payer: Self-pay | Admitting: Internal Medicine

## 2014-11-23 ENCOUNTER — Ambulatory Visit (INDEPENDENT_AMBULATORY_CARE_PROVIDER_SITE_OTHER): Payer: Medicare Other | Admitting: Internal Medicine

## 2014-11-23 VITALS — BP 140/84 | HR 52 | Ht 68.0 in | Wt 192.8 lb

## 2014-11-23 DIAGNOSIS — I6990 Unspecified sequelae of unspecified cerebrovascular disease: Secondary | ICD-10-CM | POA: Diagnosis not present

## 2014-11-23 DIAGNOSIS — Z0000 Encounter for general adult medical examination without abnormal findings: Secondary | ICD-10-CM | POA: Diagnosis not present

## 2014-11-23 DIAGNOSIS — E785 Hyperlipidemia, unspecified: Secondary | ICD-10-CM

## 2014-11-23 DIAGNOSIS — C61 Malignant neoplasm of prostate: Secondary | ICD-10-CM | POA: Diagnosis not present

## 2014-11-23 DIAGNOSIS — R609 Edema, unspecified: Secondary | ICD-10-CM

## 2014-11-23 DIAGNOSIS — R600 Localized edema: Secondary | ICD-10-CM

## 2014-11-23 DIAGNOSIS — R233 Spontaneous ecchymoses: Secondary | ICD-10-CM

## 2014-11-23 DIAGNOSIS — Z8546 Personal history of malignant neoplasm of prostate: Secondary | ICD-10-CM | POA: Insufficient documentation

## 2014-11-23 LAB — POC URINALYSIS WITH MICROSCOPIC (NON AUTO)MANUAL RESULT
Bacteria, UA: 0
CRYSTALS: 0
Epithelial cells, urine per micros: 1
MUCUS UA: 0
RBC: 1 M/uL — AB (ref 4.69–6.13)
WBC CASTS UA: 0

## 2014-11-23 NOTE — Patient Instructions (Signed)
Health Maintenance  Topic Date Due  . TETANUS/TDAP  05/27/1956  . COLONOSCOPY  05/28/1987  . ZOSTAVAX  05/27/1997  . PNA vac Low Risk Adult (1 of 2 - PCV13) 05/27/2002  . INFLUENZA VACCINE  11/30/2014

## 2014-11-23 NOTE — Progress Notes (Signed)
Patient: [name], Male    DOB: 06-23-1935, 79 y.o.   MRN: [id number] Visit Date: 11/23/2014  Today's Provider: Halina Maidens, MD   Chief Complaint  Patient presents with  . Medicare Wellness   Subjective:    Annual wellness visit [name] is a 79 y.o. male who presents today for his Subsequent Annual Wellness Visit. He feels well. He reports exercising golf three times per week and yard work. He reports he is sleeping fairly well. He gets up 2 times per night to urinate.  ----------------------------------------------------------- HPI  Edema - he has stable mild lower extremity edema.  He plays golf and does yard work without difficulty.  He says that the edema does not get worse as the day goes one.  He does consume a lot of salt - eats out most days per week. Stroke - in 2013he had speech difficulty as the symptom.   It has completely resolved but he continues on Plavix and lipitor.  He denies any recurrence or new neurological symptoms. Prostate Cancer - has undergone XRT and is now on maintenance Lupron injections by Dr. Elnoria Howard.  Recent PSA done by Oncology was very low.  He feels well and has no urinary difficulty other than nocturia x 2.  Review of Systems  Constitutional: Negative for fever, activity change, appetite change and unexpected weight change.  HENT: Negative for ear discharge, hearing loss, tinnitus, trouble swallowing and voice change.   Eyes: Negative for pain and visual disturbance.  Respiratory: Negative for cough, chest tightness and shortness of breath.   Cardiovascular: Negative for chest pain and leg swelling.  Gastrointestinal: Negative for abdominal pain, constipation and blood in stool.  Endocrine: Negative for polyuria.  Genitourinary: Negative for dysuria and hematuria.  Musculoskeletal: Negative for back pain, arthralgias and gait problem.  Skin: Negative for color change and rash.  Neurological: Negative for dizziness, tremors and headaches.   Hematological: Negative for adenopathy. Bruises/bleeds easily.  Psychiatric/Behavioral: Negative for decreased concentration. The patient is not nervous/anxious.     History   Social History  . Marital Status: Married    Spouse Name: N/A  . Number of Children: N/A  . Years of Education: N/A   Occupational History  . Not on file.   Social History Main Topics  . Smoking status: Former Smoker    Types: Cigarettes    Quit date: 05/02/2011  . Smokeless tobacco: Not on file  . Alcohol Use: Not on file  . Drug Use: Not on file  . Sexual Activity: Not on file   Other Topics Concern  . Not on file   Social History Narrative    Patient Active Problem List   Diagnosis Date Noted  . Adenocarcinoma of prostate 11/23/2014  . Dyslipidemia 09/03/2014  . Edema extremities 09/03/2014  . Special screening for malignant neoplasm of prostate 09/03/2014  . Cerebrovascular accident, late effects 04/23/2012    Past Surgical History  Procedure Laterality Date  . Inguinal hernia repair Left     His family history includes Parkinson's disease in his mother; Stroke in his brother.    Previous Medications   ASPIRIN 81 MG TABLET    Take 1 tablet by mouth daily.   ATORVASTATIN (LIPITOR) 80 MG TABLET    TAKE ONE TABLET BY MOUTH ONCE DAILY   CLOPIDOGREL (PLAVIX) 75 MG TABLET    TAKE ONE TABLET BY MOUTH ONCE DAILY   LEUPROLIDE ACETATE (LUPRON IJ)    Inject as directed.   VITAMIN B-12 (CYANOCOBALAMIN) 1000  MCG TABLET    Take 1,000 mcg by mouth daily.    Patient Care Team: Glean Hess, MD as PCP - General (Family Medicine) Noreene Filbert, MD as Referring Physician (Radiation Oncology) Collier Flowers, MD as Referring Physician (Urology)     Objective:   Vitals: BP 140/84 mmHg  Pulse 52  Ht 5\' 8"  (1.727 m)  Wt 192 lb 12.8 oz (87.454 kg)  BMI 29.32 kg/m2  Physical Exam  Constitutional: He is oriented to person, place, and time. He appears well-developed and well-nourished.   HENT:  Head: Normocephalic.  Right Ear: External ear normal.  Left Ear: External ear normal.  Mouth/Throat: No oropharyngeal exudate.  Eyes: Conjunctivae are normal. Pupils are equal, round, and reactive to light.  Neck: Normal range of motion. Neck supple. Carotid bruit is not present. No thyromegaly present.  Cardiovascular: Normal rate, regular rhythm and normal heart sounds.   Pulses:      Radial pulses are 2+ on the right side, and 2+ on the left side.       Dorsalis pedis pulses are 2+ on the right side, and 2+ on the left side.       Posterior tibial pulses are 1+ on the right side, and 1+ on the left side.  Pulmonary/Chest: Effort normal and breath sounds normal. He has no wheezes. Right breast exhibits no mass. Left breast exhibits no mass.  Abdominal: Soft. Normal appearance and bowel sounds are normal. There is no hepatosplenomegaly. There is no tenderness. There is no rebound.  Musculoskeletal:       Right knee: He exhibits swelling (bony). He exhibits no effusion.       Left knee: He exhibits swelling and effusion.  Lymphadenopathy:    He has no cervical adenopathy.    He has no axillary adenopathy.  Neurological: He is alert and oriented to person, place, and time. He has normal strength and normal reflexes. No sensory deficit.  Skin: Skin is warm and dry. Ecchymosis (on dorsum of both hands) noted.  Psychiatric: He has a normal mood and affect. His speech is normal. Cognition and memory are normal.  Nursing note and vitals reviewed.   Activities of Daily Living In your present state of health, do you have any difficulty performing the following activities: 11/23/2014  Hearing? Y  Vision? N  Difficulty concentrating or making decisions? N  Walking or climbing stairs? N  Dressing or bathing? N  Doing errands, shopping? N    Fall Risk Assessment Fall Risk  10/29/2014  Falls in the past year? No     Patient reports there are safety devices in place in shower at  home.   Depression Screen PHQ 2/9 Scores 10/29/2014  PHQ - 2 Score 0    Cognitive Testing - 6-CIT   Correct? Score   What year is it? yes 0 Yes = 0    No = 4  What month is it? yes 0 Yes = 0    No = 3  Remember:     [address]ia [address], [place], [place]     What time is it? yes 0 Yes = 0    No = 3  Count backwards from 20 to 1 yes 0 Correct = 0    1 error = 2   More than 1 error = 4  Say the months of the year in reverse. yes 0 Correct = 0    1 error = 2   More than 1  error = 4  What address did I ask you to remember? yes 0 Correct = 0  1 error = 2    2 error = 4    3 error = 6    4 error = 8    All wrong = 10       TOTAL SCORE  0/28   Interpretation:  Normal  Normal (0-7) Abnormal (8-28)        Assessment & Plan:     Annual Wellness Visit  Reviewed patient's Family Medical History Reviewed and updated list of patient's medical providers Assessment of cognitive impairment was done Assessed patient's functional ability Established a written schedule for health screening [place] Completed and Reviewed  Exercise Activities and Dietary recommendations Goals    None       There is no immunization history on file for this patient.  Health Maintenance  Topic Date Due  . TETANUS/TDAP  05/27/1956  . COLONOSCOPY  05/28/1987  . ZOSTAVAX  05/27/1997  . PNA vac Low Risk Adult (1 of 2 - PCV13) 05/27/2002  . INFLUENZA VACCINE  11/30/2014     Discussed health benefits of physical activity, and encouraged him to engage in regular exercise appropriate for his age and condition.    ------------------------------------------------------------------------------------------------------------  1. Annual physical exam Patient declines all vaccinations Recommend routine eye exam - pt declines - POCT urinalysis dipstick  2. Dyslipidemia On statin therapy - Lipid panel  3. Edema extremities Continue to avoid sodium - Comprehensive metabolic panel -  TSH  4. Cerebrovascular accident, late effects Recovered; continue Plavix and lipitor for secondary prevention - CBC with Differential/Platelet  5. Adenocarcinoma of prostate Followed by Oncology  6. Senile ecchymosis   Halina Maidens, MD [employer]  11/23/2014

## 2014-11-24 LAB — COMPREHENSIVE METABOLIC PANEL
ALBUMIN: 3.9 g/dL (ref 3.5–4.8)
ALT: 19 IU/L (ref 0–44)
AST: 18 IU/L (ref 0–40)
Albumin/Globulin Ratio: 1.6 (ref 1.1–2.5)
Alkaline Phosphatase: 109 IU/L (ref 39–117)
BUN/Creatinine Ratio: 20 (ref 10–22)
BUN: 14 mg/dL (ref 8–27)
Bilirubin Total: 0.4 mg/dL (ref 0.0–1.2)
CO2: 24 mmol/L (ref 18–29)
CREATININE: 0.7 mg/dL — AB (ref 0.76–1.27)
Calcium: 9.5 mg/dL (ref 8.6–10.2)
Chloride: 102 mmol/L (ref 97–108)
GFR calc Af Amer: 105 mL/min/{1.73_m2} (ref >59)
GFR calc non Af Amer: 91 mL/min/{1.73_m2} (ref >59)
Globulin, Total: 2.5 g/dL (ref 1.5–4.5)
Glucose: 86 mg/dL (ref 65–99)
Potassium: 4.9 mmol/L (ref 3.5–5.2)
Sodium: 141 mmol/L (ref 134–144)
TOTAL PROTEIN: 6.4 g/dL (ref 6.0–8.5)

## 2014-11-24 LAB — CBC WITH DIFFERENTIAL/PLATELET
Basophils Absolute: 0 10*3/uL (ref 0.0–0.2)
Basos: 1 %
EOS (ABSOLUTE): 0.1 10*3/uL (ref 0.0–0.4)
EOS: 3 %
HEMATOCRIT: 41.7 % (ref 37.5–51.0)
HEMOGLOBIN: 13.6 g/dL (ref 12.6–17.7)
IMMATURE GRANULOCYTES: 0 %
Immature Grans (Abs): 0 10*3/uL (ref 0.0–0.1)
Lymphocytes Absolute: 0.8 10*3/uL (ref 0.7–3.1)
Lymphs: 23 %
MCH: 31.5 pg (ref 26.6–33.0)
MCHC: 32.6 g/dL (ref 31.5–35.7)
MCV: 97 fL (ref 79–97)
Monocytes Absolute: 0.2 10*3/uL (ref 0.1–0.9)
Monocytes: 6 %
Neutrophils Absolute: 2.4 10*3/uL (ref 1.4–7.0)
Neutrophils: 67 %
Platelets: 196 10*3/uL (ref 150–379)
RBC: 4.32 x10E6/uL (ref 4.14–5.80)
RDW: 13.8 % (ref 12.3–15.4)
WBC: 3.6 10*3/uL (ref 3.4–10.8)

## 2014-11-24 LAB — TSH: TSH: 1.49 u[IU]/mL (ref 0.450–4.500)

## 2014-11-24 LAB — LIPID PANEL
CHOLESTEROL TOTAL: 108 mg/dL (ref 100–199)
Chol/HDL Ratio: 2.5 ratio units (ref 0.0–5.0)
HDL: 44 mg/dL (ref >39)
LDL CALC: 48 mg/dL (ref 0–99)
Triglycerides: 81 mg/dL (ref 0–149)
VLDL CHOLESTEROL CAL: 16 mg/dL (ref 5–40)

## 2015-02-17 ENCOUNTER — Ambulatory Visit: Payer: Medicare Other | Admitting: Urology

## 2015-02-17 ENCOUNTER — Encounter: Payer: Self-pay | Admitting: Urology

## 2015-02-17 VITALS — BP 170/92 | HR 51 | Temp 98.3°F | Ht 69.0 in | Wt 196.3 lb

## 2015-02-17 DIAGNOSIS — C61 Malignant neoplasm of prostate: Secondary | ICD-10-CM

## 2015-02-17 DIAGNOSIS — R350 Frequency of micturition: Secondary | ICD-10-CM

## 2015-02-17 LAB — URINALYSIS, COMPLETE
Bilirubin, UA: NEGATIVE
GLUCOSE, UA: NEGATIVE
Ketones, UA: NEGATIVE
Leukocytes, UA: NEGATIVE
NITRITE UA: NEGATIVE
PH UA: 6 (ref 5.0–7.5)
Protein, UA: NEGATIVE
Specific Gravity, UA: 1.025 (ref 1.005–1.030)
UUROB: 0.2 mg/dL (ref 0.2–1.0)

## 2015-02-17 LAB — MICROSCOPIC EXAMINATION
BACTERIA UA: NONE SEEN
Epithelial Cells (non renal): NONE SEEN /hpf (ref 0–10)
RBC MICROSCOPIC, UA: NONE SEEN /HPF (ref 0–?)
RENAL EPITHEL UA: NONE SEEN /HPF
WBC UA: NONE SEEN /HPF (ref 0–?)

## 2015-02-17 MED ORDER — LEUPROLIDE ACETATE (6 MONTH) 45 MG IM KIT
45.0000 mg | PACK | Freq: Once | INTRAMUSCULAR | Status: AC
  Administered 2015-02-17: 45 mg via INTRAMUSCULAR

## 2015-02-17 NOTE — Progress Notes (Unsigned)
Lupron IM Injection   Due to Prostate Cancer patient is present today for a Lupron Injection.  Medication: Lupron 6 month Dose: 45 mg  Location: right upper outer buttocks [id number]  Exp: 06/23/2016  Patient tolerated well, no complications were noted  Performed by: Vickki Hearing  Follow up: 24mths

## 2015-02-17 NOTE — Progress Notes (Unsigned)
Here for Lupron injection today at 6 month intervals. Has had IMR T therapy for his prostate cancer. His most recent PSA is 0.09 he has no voiding dysfunction no urgency frequency dysuria or hematuria. In view of his previous history of heavy smoking he needs to be closely followed certainly cystoscoped if he ever has any gross hematuria or microscopic hematuria of significant amount. I discussed this with his wife. His hot flashes are occasional and last about 2 minutes so he does not want any therapy for his follow-up is in 6 months for another Lupron injection.

## 2015-04-27 ENCOUNTER — Other Ambulatory Visit: Payer: Self-pay | Admitting: Internal Medicine

## 2015-05-06 ENCOUNTER — Inpatient Hospital Stay: Payer: Medicare Other | Attending: Radiation Oncology

## 2015-05-06 ENCOUNTER — Ambulatory Visit
Admission: RE | Admit: 2015-05-06 | Discharge: 2015-05-06 | Disposition: A | Discharge status: Home / Self Care | Payer: Medicare Other | Source: Ambulatory Visit | Attending: Radiation Oncology | Admitting: Radiation Oncology

## 2015-05-06 ENCOUNTER — Encounter: Payer: Self-pay | Admitting: Radiation Oncology

## 2015-05-06 ENCOUNTER — Other Ambulatory Visit: Payer: Self-pay | Admitting: *Deleted

## 2015-05-06 VITALS — BP 150/87 | HR 57 | Temp 97.8°F | Wt 194.6 lb

## 2015-05-06 DIAGNOSIS — C61 Malignant neoplasm of prostate: Secondary | ICD-10-CM

## 2015-05-06 HISTORY — DX: Essential (primary) hypertension: I10

## 2015-05-06 LAB — PSA: PSA: 0.04 ng/mL (ref 0.00–4.00)

## 2015-05-06 NOTE — Progress Notes (Signed)
Radiation Oncology Follow up Note  Name: [name]   Date:   05/06/2015 MRN:  [id number] DOB: [birth year]    This 80 y.o. male presents to the clinic today for follow-up for prostate cancer stage IIa (T2 CN 0 M0 presenting with a Gleason score of 7 (4+3) and a PSA of 12.Marland Kitchen  REFERRING PROVIDER: Glean Hess, MD  HPI: Patient is a 80 year old male now out over a year having completed IM RT radiation therapy to prostate and pelvic nodes for Gleason 7 (4+3) presenting the PSA of 12.Marland Kitchen He is seen today in routine follow-up and is doing well specifically denies diarrhea dysuria or any other GI/GU complaints. His last PSA 6 months prior was [id number]. COMPLICATIONS OF TREATMENT: none  FOLLOW UP COMPLIANCE: keeps appointments   PHYSICAL EXAM:  BP 150/87 mmHg  Pulse 57  Temp(Src) 97.8 F (36.6 C)  Wt 194 lb 8.9 oz (88.25 kg) On rectal exam rectal sphincter tone is good. Prostate is smooth contracted without evidence of nodularity or mass. Sulcus is preserved bilaterally. No discrete nodularity is identified. No other rectal abnormalities are noted. Well-developed well-nourished patient in NAD. HEENT reveals PERLA, EOMI, discs not visualized.  Oral cavity is clear. No oral mucosal lesions are identified. Neck is clear without evidence of cervical or supraclavicular adenopathy. Lungs are clear to A&P. Cardiac examination is essentially unremarkable with regular rate and rhythm without murmur rub or thrill. Abdomen is benign with no organomegaly or masses noted. Motor sensory and DTR levels are equal and symmetric in the upper and lower extremities. Cranial nerves II through XII are grossly intact. Proprioception is intact. No peripheral adenopathy or edema is identified. No motor or sensory levels are noted. Crude visual fields are within normal range.  RADIOLOGY RESULTS: No current films for review  PLAN: Present time he is doing well he had excellent biochemical response to his treatments. I'm please  was overall progress. I've asked to see him back in 6 months for follow-up. PSA was obtained today and will be be reported separately. Patient knows to call with any concerns.  I would like to take this opportunity for allowing me to participate in the care of your patient.Armstead Peaks., MD

## 2015-08-31 ENCOUNTER — Ambulatory Visit: Payer: Medicare Other | Admitting: Urology

## 2015-10-01 IMAGING — NM NUCLEAR MEDICINE WHOLE BODY BONE SCINTIGRAPHY
2 series · 10 of 10 positions shown · non-contrast
Comparison: none

CLINICAL DATA: Prostate cancer.

EXAM:
NUCLEAR MEDICINE WHOLE BODY BONE SCAN
TECHNIQUE: Whole body anterior and posterior images were obtained approximately
3 hours after intravenous injection of radiopharmaceutical.
RADIOPHARMACEUTICALS:  22.7 mCi 6echnetium-SS MDP

[Series 1000: statics · 2.40mm/px · 4 acquisitions, 8 frames shown]
[im 1/4]
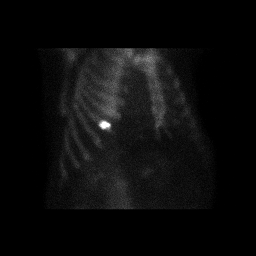
[im 1/4]
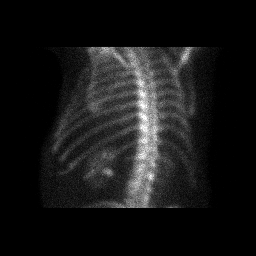
[im 2/4]
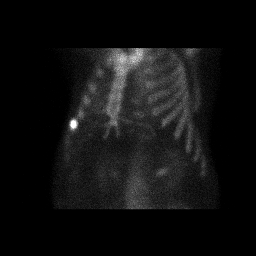
[im 2/4]
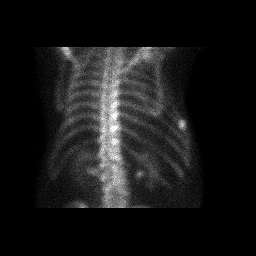
[im 3/4]
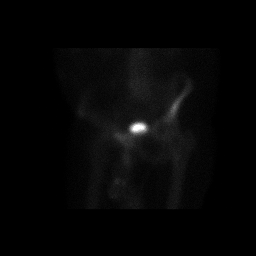
[im 3/4]
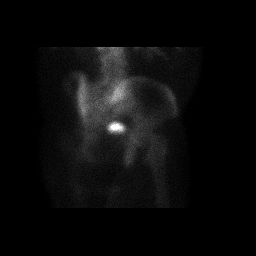
[im 4/4]
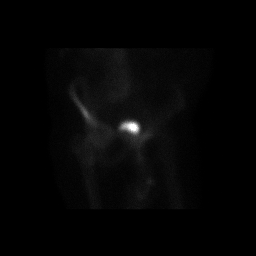
[im 4/4]
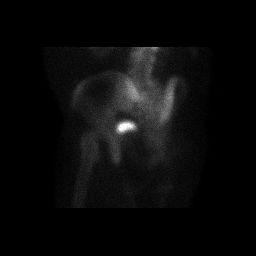

[Series 1000: 3 hr wholebody · 2.40mm/px · 2 of 2 frames shown]
[frame 1/2]
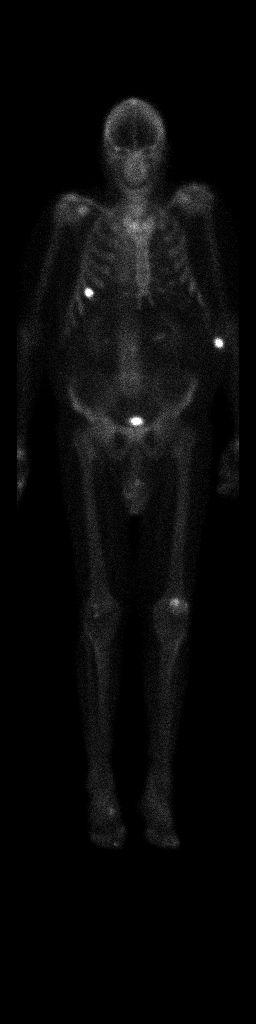
[frame 2/2]
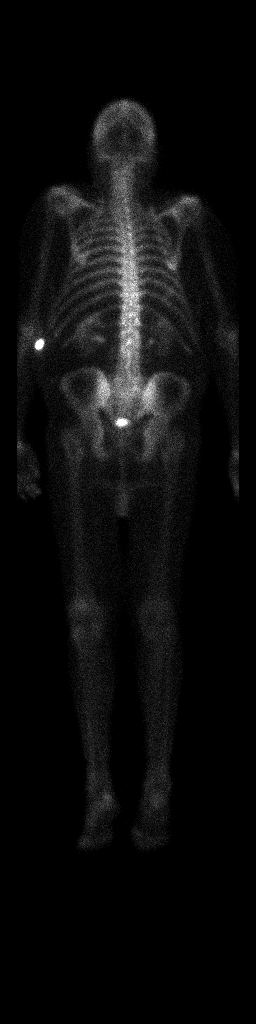

[10 of 10 positions shown; findings below may reference images not displayed]

FINDINGS: Bilateral renal function and excretion is present. Focal intense
area of increased activity noted over the right anterior sixth rib.
Right rib series suggested for further evaluation. Mild symmetric
activity noted over both shoulders most likely degenerative. Mild
activity noted over both knees and right foot, most likely
degenerative.
IMPRESSION: 1. Intense focus of increased activity over the anterior most aspect
of the right sixth rib. Rib series suggested for further evaluation.

2. Findings suggestive degenerative changes both shoulders, both
knees, right foot. No other significant abnormality.

## 2015-10-12 ENCOUNTER — Ambulatory Visit
Admission: RE | Admit: 2015-10-12 | Discharge: 2015-10-12 | Disposition: A | Discharge status: Home / Self Care | Payer: Medicare Other | Source: Ambulatory Visit | Attending: Urology | Admitting: Urology

## 2015-10-12 ENCOUNTER — Ambulatory Visit (INDEPENDENT_AMBULATORY_CARE_PROVIDER_SITE_OTHER): Payer: Medicare Other | Admitting: Urology

## 2015-10-12 ENCOUNTER — Encounter: Payer: Self-pay | Admitting: Urology

## 2015-10-12 VITALS — BP 173/116 | HR 67 | Ht 69.0 in | Wt 194.9 lb

## 2015-10-12 DIAGNOSIS — S2242XA Multiple fractures of ribs, left side, initial encounter for closed fracture: Secondary | ICD-10-CM | POA: Diagnosis not present

## 2015-10-12 DIAGNOSIS — R310 Gross hematuria: Secondary | ICD-10-CM

## 2015-10-12 DIAGNOSIS — N401 Enlarged prostate with lower urinary tract symptoms: Secondary | ICD-10-CM | POA: Diagnosis not present

## 2015-10-12 DIAGNOSIS — N138 Other obstructive and reflux uropathy: Secondary | ICD-10-CM

## 2015-10-12 DIAGNOSIS — Z8546 Personal history of malignant neoplasm of prostate: Secondary | ICD-10-CM | POA: Diagnosis not present

## 2015-10-12 DIAGNOSIS — X58XXXA Exposure to other specified factors, initial encounter: Secondary | ICD-10-CM | POA: Insufficient documentation

## 2015-10-12 DIAGNOSIS — S3991XA Unspecified injury of abdomen, initial encounter: Secondary | ICD-10-CM | POA: Diagnosis not present

## 2015-10-12 LAB — MICROSCOPIC EXAMINATION

## 2015-10-12 LAB — URINALYSIS, COMPLETE
Bilirubin, UA: NEGATIVE
GLUCOSE, UA: NEGATIVE
Ketones, UA: NEGATIVE
LEUKOCYTES UA: NEGATIVE
NITRITE UA: NEGATIVE
PH UA: 5 (ref 5.0–7.5)
Urobilinogen, Ur: 0.2 mg/dL (ref 0.2–1.0)

## 2015-10-12 MED ORDER — IOPAMIDOL (ISOVUE-300) INJECTION 61%
125.0000 mL | Freq: Once | INTRAVENOUS | Status: AC | PRN
  Administered 2015-10-12: 125 mL via INTRAVENOUS

## 2015-10-12 NOTE — Progress Notes (Signed)
10/12/2015 9:23 AM   [name] 1935/08/11 [id number]  Referring provider: Glean Hess, MD [address] [place], [place] [address]  Chief Complaint  Patient presents with  . Hematuria    patient fell on golf course, blood in urine    HPI: Patient is a 80 year old Caucasian male who fell when his putter broke in half on the golf course.  After the fall, he continued to play 8 holes.  That evening, he had difficulty moving around and had pain in his left side.  The pain has been so severe that his wife has had to help him ambulate.  He developed gross  hematuria last evening.    There has not been flank ecchymosis, signs of hypotension, tachycardia, fevers, chills, nausea or vomiting. He has not had much fluid intake because of the pain.  He is still voiding.    He is having urgency, dysuria, nocturia, incontinence, intermittency, hesitancy and straining to urinate.  Wife states that these are baseline symptoms.  His UA is clear yellow with 3-10 rbc's per high-power field.  Patient is over one year out having completed IM RT radiation therapy to prostate and pelvic nodes for a Gleason 7 (4+3) presenting the PSA of 12.  Last PSA was 0.04 on 05/06/2015.  Patient having significant lower urinary tract symptoms. His I PSS score today is 23/2.        IPSS      10/12/15 0800       International Prostate Symptom Score   How often have you had the sensation of not emptying your bladder? Less than half the time     How often have you had to urinate less than every two hours? Less than half the time     How often have you found you stopped and started again several times when you urinated? More than half the time     How often have you found it difficult to postpone urination? Almost always     How often have you had a weak urinary stream? More than half the time     How often have you had to strain to start urination? More than half the time     How many times did you  typically get up at night to urinate? 2 Times     Total IPSS Score 23     Quality of Life due to urinary symptoms   If you were to spend the rest of your life with your urinary condition just the way it is now how would you feel about that? Mostly Satisfied        Score:  1-7 Mild 8-19 Moderate 20-35 Severe    PMH: Past Medical History  Diagnosis Date  . Hypertension   . HLD (hyperlipidemia)   . Edema extremities   . Kidney stones   . Elevated PSA   . Prostate cancer [facility])     Surgical History: Past Surgical History  Procedure Laterality Date  . Inguinal hernia repair Left     Home Medications:    Medication List       This list is accurate as of: 10/12/15  9:23 AM.  Always use your most recent med list.               aspirin 81 MG tablet  Take 1 tablet by mouth daily.     atorvastatin 80 MG tablet  Commonly known as:  LIPITOR  TAKE ONE  TABLET BY MOUTH ONCE DAILY     cholecalciferol 400 units Tabs tablet  Commonly known as:  VITAMIN D  Take 400 Units by mouth. Reported on 05/06/2015     clopidogrel 75 MG tablet  Commonly known as:  PLAVIX  TAKE ONE TABLET BY MOUTH ONCE DAILY     LUPRON IJ  Inject as directed. Reported on 10/12/2015     vitamin B-12 1000 MCG tablet  Commonly known as:  CYANOCOBALAMIN  Take 1,000 mcg by mouth daily.        Allergies: No Known Allergies  Family History: Family History  Problem Relation Age of Onset  . Parkinson's disease Mother   . Stroke Brother   . Kidney disease Neg Hx   . Prostate cancer Brother     Social History:  reports that he quit smoking about 4 years ago. His smoking use included Cigarettes. He does not have any smokeless tobacco history on file. He reports that he does not drink alcohol or use illicit drugs.  ROS: UROLOGY Frequent Urination?: No Hard to postpone urination?: Yes Burning/pain with urination?: Yes Get up at night to urinate?: Yes Leakage of urine?: Yes Urine stream starts and  stops?: Yes Trouble starting stream?: Yes Do you have to strain to urinate?: Yes Blood in urine?: Yes Urinary tract infection?: No Sexually transmitted disease?: No Injury to kidneys or bladder?: No Painful intercourse?: No Weak stream?: No Erection problems?: No Penile pain?: No  Gastrointestinal Nausea?: No Vomiting?: No Indigestion/heartburn?: No Diarrhea?: No Constipation?: No  Constitutional Fever: No Night sweats?: No Weight loss?: No Fatigue?: No  Skin Skin rash/lesions?: No Itching?: No  Eyes Blurred vision?: No Double vision?: No  Ears/Nose/Throat Sore throat?: No Sinus problems?: No  Hematologic/Lymphatic Swollen glands?: No Easy bruising?: No  Cardiovascular Leg swelling?: No Chest pain?: No  Respiratory Cough?: No Shortness of breath?: No  Endocrine Excessive thirst?: No  Musculoskeletal Back pain?: No Joint pain?: No  Neurological Headaches?: No Dizziness?: No  Psychologic Depression?: No Anxiety?: No  Physical Exam: BP 173/116 mmHg  Pulse 67  Ht 5\' 9"  (1.753 m)  Wt 194 lb 14.4 oz (88.406 kg)  BMI 28.77 kg/m2  Constitutional: Well nourished. Alert and oriented, No acute distress. HEENT: [place] AT, moist mucus membranes. Trachea midline, no masses. Cardiovascular: No clubbing, cyanosis, or edema. Respiratory: Normal respiratory effort, no increased work of breathing.  Left ribs clicking on exam.  Tender on palpation.   GI: Abdomen is soft, non tender, non distended, no abdominal masses. Liver and spleen not palpable.  No hernias appreciated.  Stool sample for occult testing is not indicated.   GU: No CVA tenderness.  No flank ecchymosis.  No bladder fullness or masses.  Patient with uncircumcised phallus. Foreskin easily retracted  Urethral meatus is patent.  No penile discharge. No penile lesions or rashes. Scrotum without lesions, cysts, rashes and/or edema.  Testicles are located scrotally bilaterally. No masses are appreciated  in the testicles. Left and right epididymis are normal. Rectal: Patient with  normal sphincter tone. Anus and perineum without scarring or rashes. No rectal masses are appreciated. Prostate is approximately 45 grams, small nodule in right base are appreciated. Seminal vesicles are normal. Skin: No rashes, bruises or suspicious lesions. Lymph: No cervical or inguinal adenopathy. Neurologic: Grossly intact, no focal deficits, moving all 4 extremities. Psychiatric: Normal mood and affect.  Laboratory Data: Lab Results  Component Value Date   WBC 3.6 11/23/2014   HGB 13.8 03/18/2014   HCT 41.7  11/23/2014   MCV 97 11/23/2014   PLT 196 11/23/2014    Lab Results  Component Value Date   CREATININE 0.70* 11/23/2014    Lab Results  Component Value Date   PSA 0.04 05/06/2015   PSA 0.09 10/29/2014   PSA 12.6 08/12/2013     Lab Results  Component Value Date   TSH 1.490 11/23/2014       Component Value Date/Time   CHOL 108 11/23/2014 1117   CHOL 102 08/12/2013   CHOL 162 04/25/2012 0358   HDL 44 11/23/2014 1117   HDL 41 08/12/2013   HDL 30* 04/25/2012 0358   CHOLHDL 2.5 11/23/2014 1117   VLDL 19 04/25/2012 0358   LDLCALC 48 11/23/2014 1117   LDLCALC 48 08/12/2013   LDLCALC 113* 04/25/2012 0358    Lab Results  Component Value Date   AST 18 11/23/2014   Lab Results  Component Value Date   ALT 19 11/23/2014   Urinalysis Results for orders placed or performed in visit on 10/12/15  CULTURE, URINE COMPREHENSIVE  Result Value Ref Range   Urine Culture, Comprehensive Final report    Result 1 Comment   Microscopic Examination  Result Value Ref Range   WBC, UA 0-5 0 -  5 /hpf   RBC, UA 3-10 (A) 0 -  2 /hpf   Epithelial Cells (non renal) 0-10 0 - 10 /hpf   Mucus, UA Present (A) Not Estab.   Bacteria, UA Few None seen/Few  Urinalysis, Complete  Result Value Ref Range   Specific Gravity, UA >1.030 (H) 1.005 - 1.030   pH, UA 5.0 5.0 - 7.5   Color, UA Yellow Yellow    Appearance Ur Clear Clear   Leukocytes, UA Negative Negative   Protein, UA 1+ (A) Negative/Trace   Glucose, UA Negative Negative   Ketones, UA Negative Negative   RBC, UA 3+ (A) Negative   Bilirubin, UA Negative Negative   Urobilinogen, Ur 0.2 0.2 - 1.0 mg/dL   Nitrite, UA Negative Negative   Microscopic Examination See below:   PSA  Result Value Ref Range   Prostate Specific Ag, Serum <0.1 0.0 - 4.0 ng/mL  BUN+Creat  Result Value Ref Range   BUN 13 8 - 27 mg/dL   Creatinine, Ser 0.75 (L) 0.76 - 1.27 mg/dL   GFR calc non Af Amer 88 >59 mL/min/1.73   GFR calc Af Amer 102 >59 mL/min/1.73   BUN/Creatinine Ratio 17 10 - 24   Pertinent imaging CLINICAL DATA: Patient with history of gross hematuria status post fall. History of renal stones.  EXAM: CT ABDOMEN AND PELVIS WITHOUT AND WITH CONTRAST  TECHNIQUE: Multidetector CT imaging of the abdomen and pelvis was performed following the standard protocol before and following the bolus administration of intravenous contrast.  CONTRAST: 125 cc Isovue-300  COMPARISON: None.  FINDINGS: Lower chest: Normal heart size. Dependent scarring and atelectasis within the lower lobes bilaterally. No pleural effusion.  Hepatobiliary: Liver is normal in size and contour. No focal lesion is identified. Gallbladder is unremarkable.  Pancreas: Unremarkable  Spleen: Unremarkable  Adrenals/Urinary Tract: There is a 2.2 cm left adrenal nodule with an internal density of -4 Hounsfield units (image 23; series 2) compatible with adenoma. Mild right adrenal gland thickening. Noncontrast images demonstrate no nephroureterolithiasis. No hydronephrosis. Urinary bladder is unremarkable. Kidneys enhance symmetrically with contrast. There is a 2.1 cm cyst within the interpolar region of the left kidney. Too small to characterize low-attenuation lesion within the inferior pole of the  right kidney. Delayed phase images demonstrate excretion  of contrast material into the bilateral renal collecting systems. The mid and distal ureters as well as urinary bladder are not included on delayed phase images. No abnormal filling defects are identified within the renal collecting systems bilaterally.  Stomach/Bowel: No abnormal bowel wall thickening or evidence for bowel obstruction. Descending and sigmoid colonic diverticulosis without CT evidence for acute diverticulitis. Multiple surgical clips within the anterior abdomen. No evidence for bowel obstruction. No free fluid or free intraperitoneal air. Normal morphology of the stomach.  Vascular/Lymphatic: Normal caliber abdominal aorta. Peripheral calcified atherosclerotic plaque. No retroperitoneal lymphadenopathy.  Other: Small fat containing right inguinal hernia.  Musculoskeletal: Mildly displaced left sixth, seventh and ninth rib fractures. Lower lumbar spine degenerative changes. L5-S1 pars defects with grade 1 anterolisthesis at this level.  IMPRESSION: Acute mildly displaced left sixth, seventh and ninth rib fractures.  Otherwise no evidence for traumatic visceral injury within the abdomen or pelvis.  These results were called by telephone at the time of interpretation on 10/12/2015 at 1145 pm to Dr. Zara Council , who verbally acknowledged these results.   Electronically Signed  By: Lovey Newcomer M.D.  On: 10/12/2015 12:12    Assessment & Plan:    1. Gross hematuria:   Patient fell yesterday during a golf game.  He fell on his left side and has had extreme tenderness in that area since that time.  He then subsequently developed gross hematuria.  On exam his left ribs feel mobile and are clicking and tender to palpation.  I am fearful he may have had a renal injury. He will be scheduled for a stat CT urogram.  Further plans based on CT results.  - Urinalysis, Complete - CULTURE, URINE COMPREHENSIVE - BUN+Creat  2. History of prostate cancer:    stage IIa (T2 CN 0 M0 presenting with a Gleason score of 7 (4+3) and a PSA of 12.  S/P IM RT completed in 05/2014.  - PSA  3. BPH with LUTS:   IPSS score is 23/2.  We will readdress after hematuria workup is complete.   Return for STAT CT Urogram for possible renal injury.  These notes generated with voice recognition software. I apologize for typographical errors.  Zara Council, PA-C  [facility] [address], [place], [place] [address] [phone]  Addendum:   CT Urogram did not demonstrate a renal injury.  He will be scheduled for a cystoscopy to complete the hematuria work up.

## 2015-10-13 ENCOUNTER — Telehealth: Payer: Self-pay

## 2015-10-13 LAB — BUN+CREAT
BUN / CREAT RATIO: 17 (ref 10–24)
BUN: 13 mg/dL (ref 8–27)
CREATININE: 0.75 mg/dL — AB (ref 0.76–1.27)
GFR, EST AFRICAN AMERICAN: 102 mL/min/{1.73_m2} (ref >59)
GFR, EST NON AFRICAN AMERICAN: 88 mL/min/{1.73_m2} (ref >59)

## 2015-10-13 LAB — PSA: Prostate Specific Ag, Serum: <0.1 ng/mL (ref 0.0–4.0)

## 2015-10-13 LAB — POCT I-STAT CREATININE: CREATININE: 0.8 mg/dL (ref 0.61–1.24)

## 2015-10-13 NOTE — Telephone Encounter (Signed)
-----   Message from Nori Riis, PA-C sent at 10/13/2015  7:58 AM EDT ----- Patient needs to be scheduled for a cystoscopy.

## 2015-10-13 NOTE — Telephone Encounter (Signed)
I scheduled him for 11-03-15. His wife wanted to wait a little while until he healed a little bit  michelle

## 2015-10-14 LAB — CULTURE, URINE COMPREHENSIVE

## 2015-10-14 IMAGING — CR DG RIBS 2V*R*
1 series · 5 of 5 positions shown · non-contrast
Comparison: Bone scan of November 14, 2013.

CLINICAL DATA: Abnormal activity seen on bone scan.

EXAM:
RIGHT RIBS - 2 VIEW

[Series 1: w chest pa · 0.14mm/px · 5 of 5 slices shown]
[im 1/5]
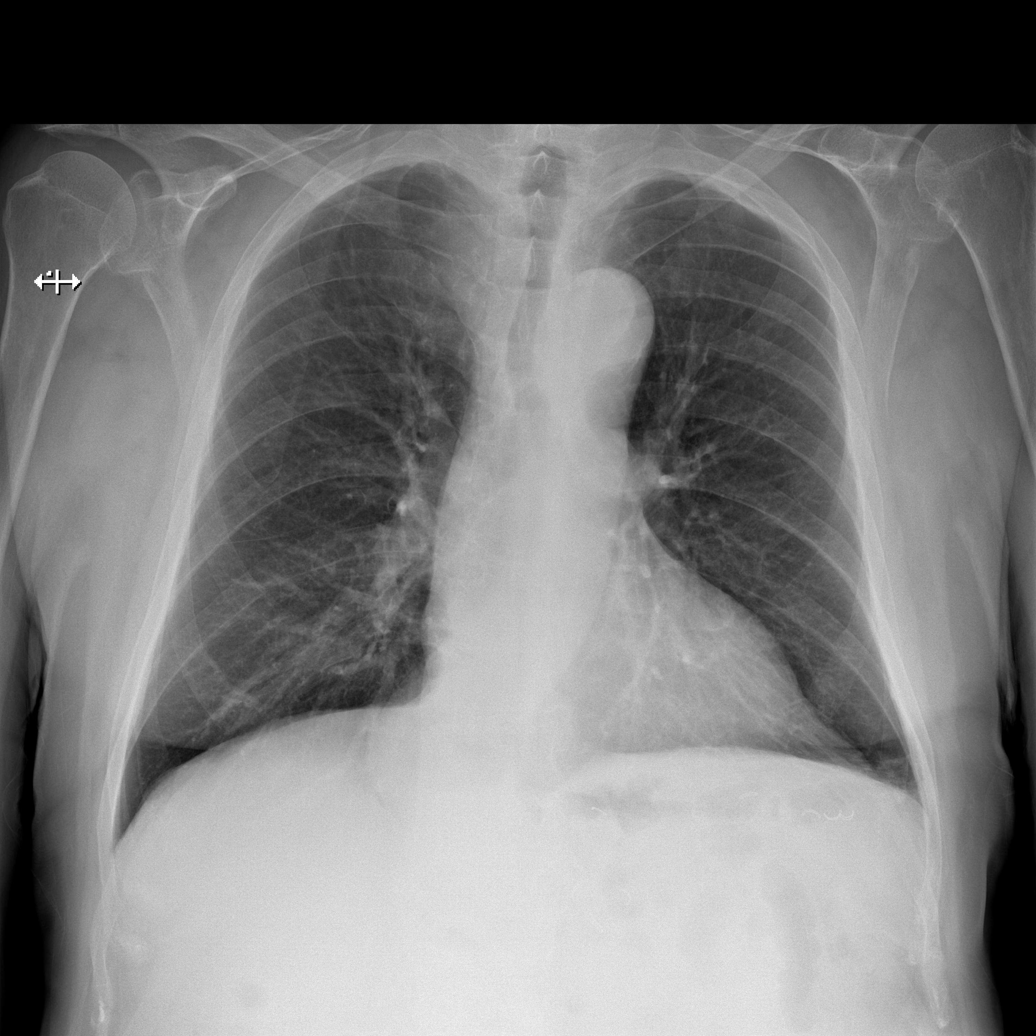
[im 2/5]
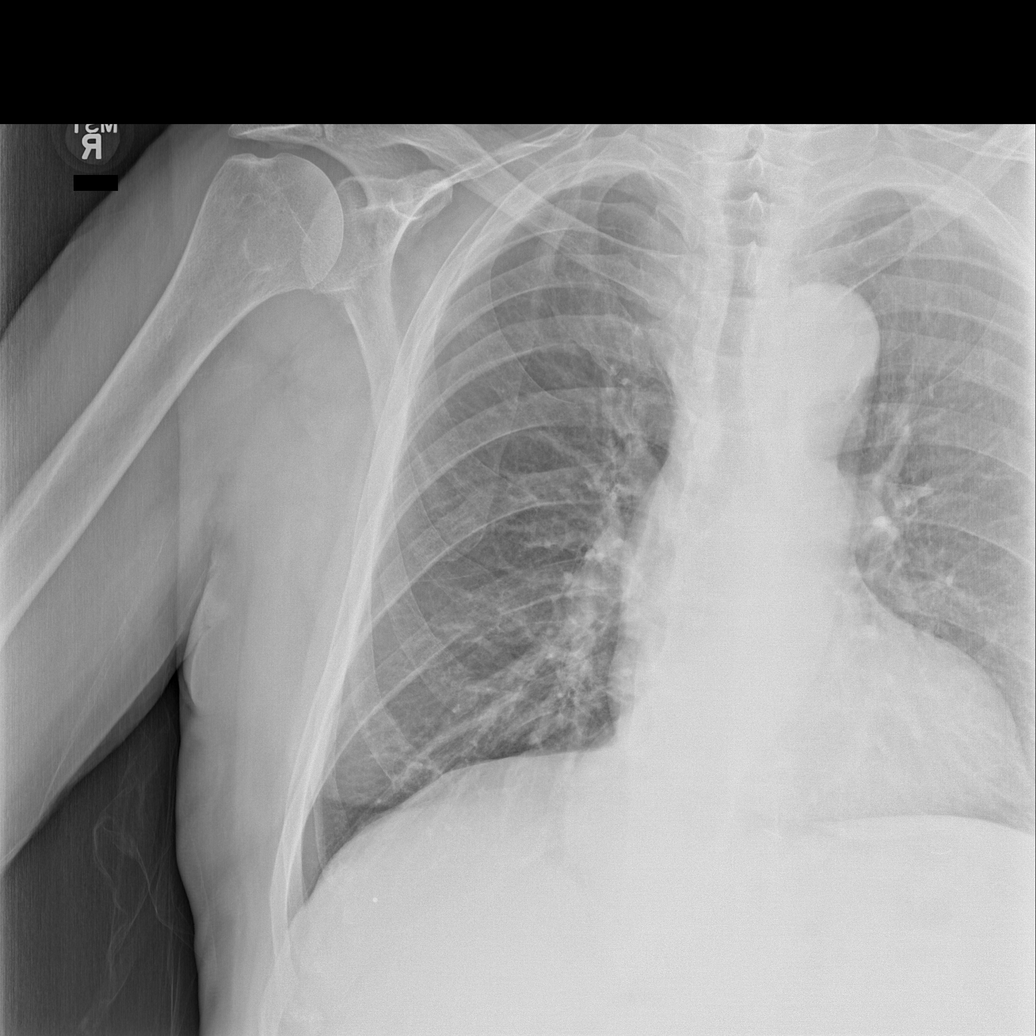
[im 3/5]
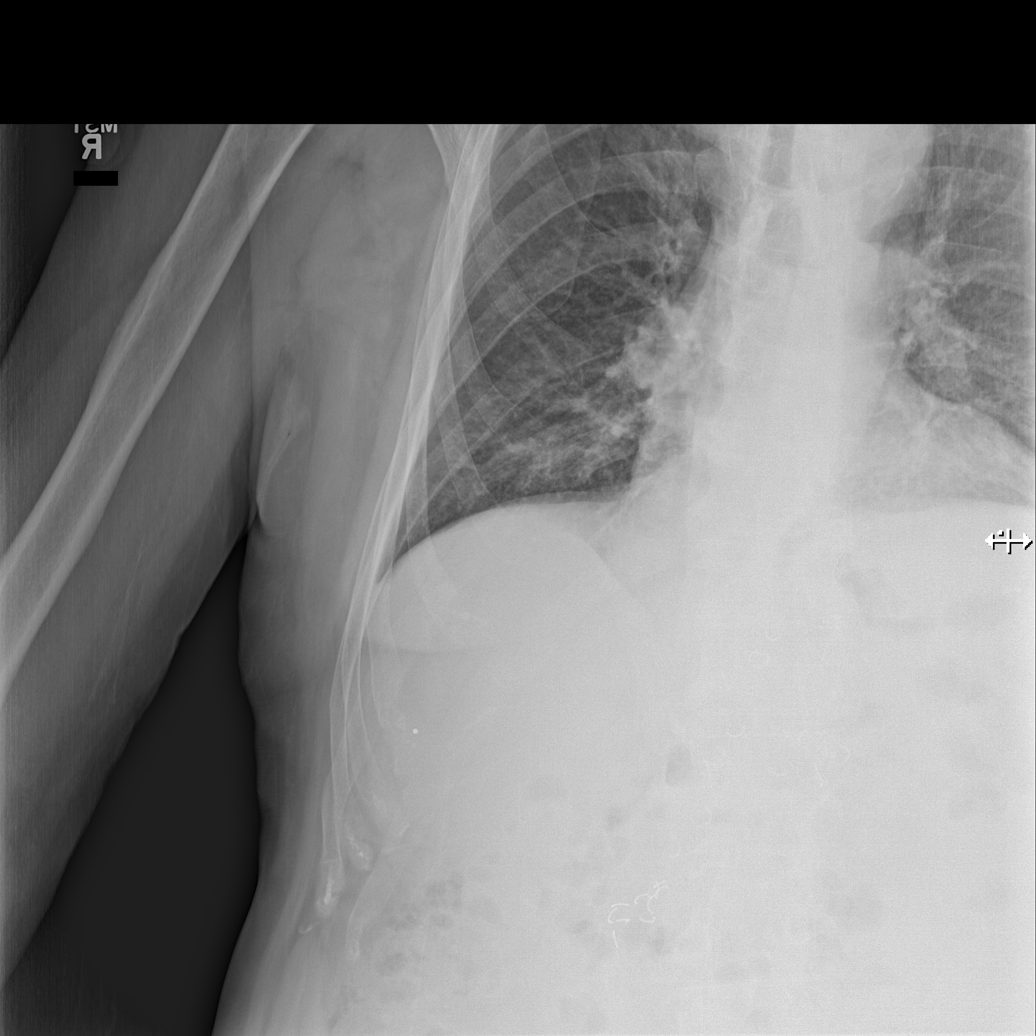
[im 4/5]
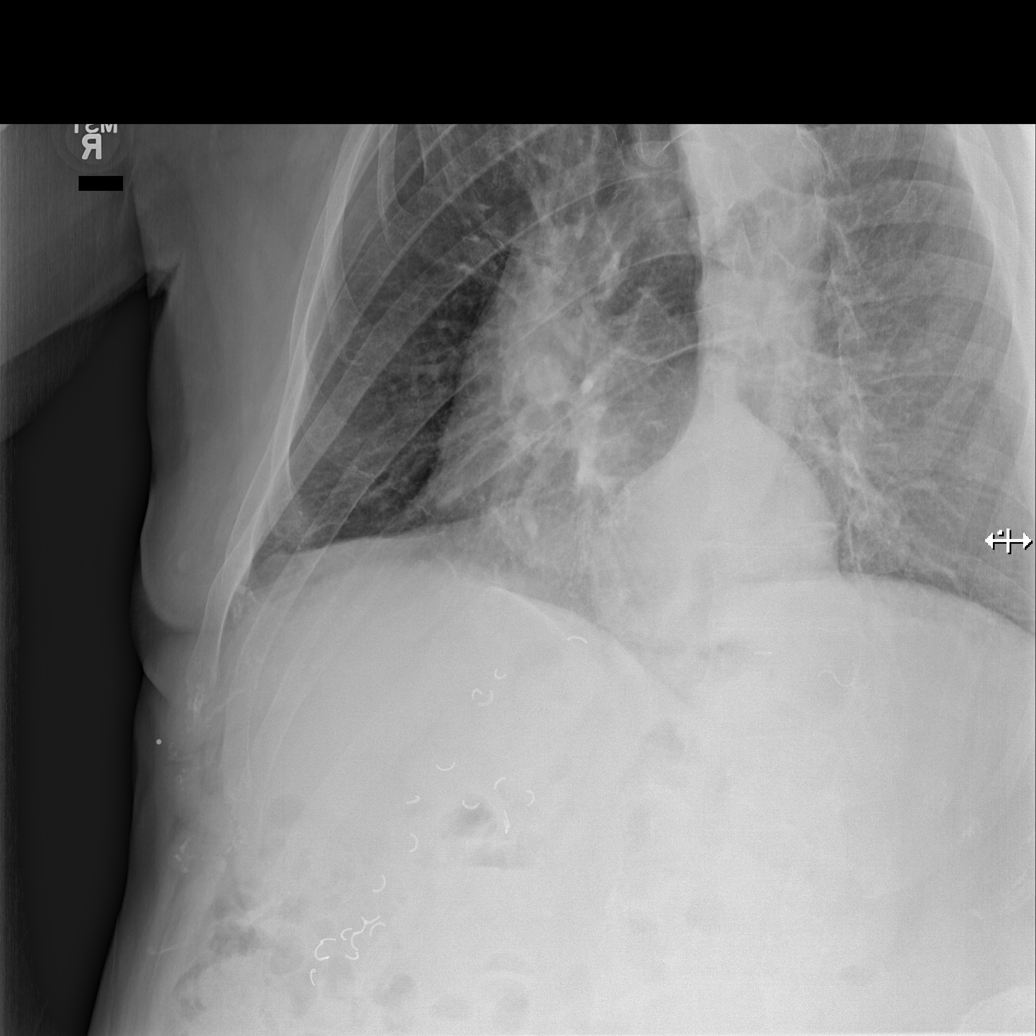
[im 5/5]
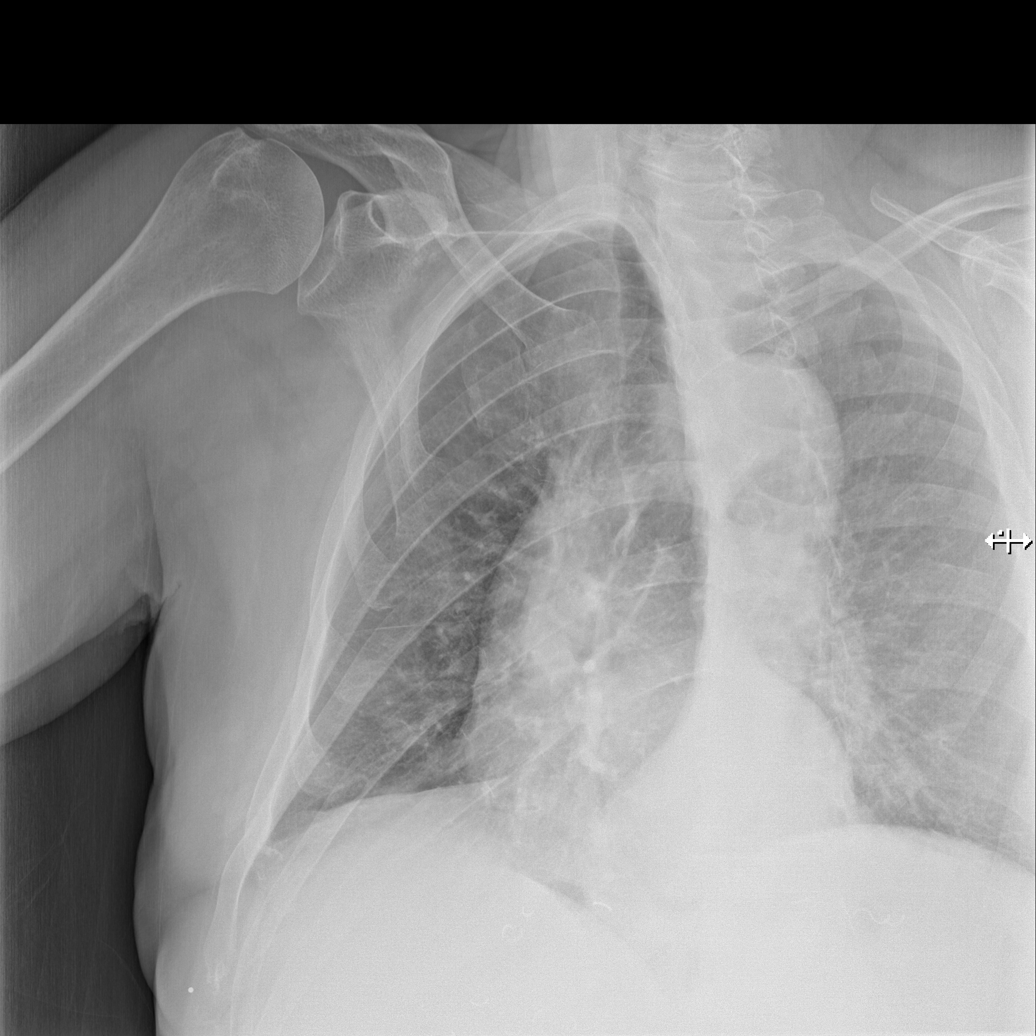

[5 of 5 positions shown; findings below may reference images not displayed]

FINDINGS: No definite fracture or dislocation is noted involving the right
ribs. No acute cardiopulmonary abnormality is noted. On 1 oblique
projection, there is the suggestion of a rounded lucency involving
the anterior portion of the right sixth rib. Potentially this may
represent a metastatic lesion.
IMPRESSION: There is the suggestion of a rounded lucency involving the anterior
portion of the right 6 rib seen on 1 oblique projection back
correlates in position to abnormality seen on bone scan. Potentially
this may represent a metastatic lesion.

## 2015-10-24 ENCOUNTER — Other Ambulatory Visit: Payer: Self-pay | Admitting: Internal Medicine

## 2015-11-03 ENCOUNTER — Ambulatory Visit (INDEPENDENT_AMBULATORY_CARE_PROVIDER_SITE_OTHER): Payer: Medicare Other | Admitting: Urology

## 2015-11-03 VITALS — BP 199/100 | HR 64 | Ht 69.0 in | Wt 193.0 lb

## 2015-11-03 DIAGNOSIS — R310 Gross hematuria: Secondary | ICD-10-CM

## 2015-11-03 LAB — URINALYSIS, COMPLETE
Bilirubin, UA: NEGATIVE
GLUCOSE, UA: NEGATIVE
KETONES UA: NEGATIVE
Leukocytes, UA: NEGATIVE
NITRITE UA: NEGATIVE
Protein, UA: NEGATIVE
UUROB: 0.2 mg/dL (ref 0.2–1.0)
pH, UA: 5 (ref 5.0–7.5)

## 2015-11-03 LAB — MICROSCOPIC EXAMINATION: Bacteria, UA: NONE SEEN

## 2015-11-03 MED ORDER — LIDOCAINE HCL 2 % EX GEL
1.0000 "application " | Freq: Once | CUTANEOUS | Status: AC
  Administered 2015-11-03: 1 via URETHRAL

## 2015-11-03 MED ORDER — CIPROFLOXACIN HCL 500 MG PO TABS
500.0000 mg | ORAL_TABLET | Freq: Once | ORAL | Status: AC
  Administered 2015-11-03: 500 mg via ORAL

## 2015-11-03 NOTE — Progress Notes (Signed)
Patient returns in continued management of gross hematuria and prostate cancer.   #1-gross hematuria-patient fell on a golf course last month and noticed red urine when he voided. He underwent a CT scan of the abdomen and pelvis which was benign. I reviewed the images. For some reason they did not include the mid and distal ureters and the bladder and the delayed imaging. Exposure risks include smoking and radiation therapy. The patient returns today for cystoscopy. His urine has been clearing he's had no dysuria.  I reviewed the CT images.  #2 prostate cancer-patient underwent radiation therapy for a Gleason 4+3=7, PSA 12 prostate cancer completed Jan 2016. I did not see evidence of androgen deprivation. His June 2017 PSA was less than 0.1.   Procedure-Cystoscopy - patient given Cipro. After consent he was placed in supine position and prepped and draped in the usual sterile fashion. The flexible cystoscope was passed per urethra and the urethra and bladder inspected. The scope was removed. He tolerated the procedure well. Findings: The urethra was unremarkable. There were no lesions or strictures. The prostatic urethra was patent with some visual obstruction from hypertrophy. A small amount of friable vessels present. The trigone and ureteral orifices appeared normal. There was clear efflux bilaterally. There were no stones or foreign bodies in the bladder. The bladder mucosa appeared normal without erythema or tumor.   Assessment/plan: Gross hematuria-will see him back one year for symptom check and a urinalysis or sooner if he has any issues. Benign evaluation.  Prostate cancer-patient following with Dr. Baruch Gouty. PSA undetectable.

## 2015-12-28 ENCOUNTER — Other Ambulatory Visit: Payer: Self-pay

## 2016-04-18 ENCOUNTER — Other Ambulatory Visit: Payer: Self-pay | Admitting: Internal Medicine

## 2016-04-18 MED ORDER — ATORVASTATIN CALCIUM 80 MG PO TABS: 80.0000 mg | ORAL_TABLET | Freq: Every day | ORAL | 0 refills | Status: DC

## 2016-04-18 MED ORDER — CLOPIDOGREL BISULFATE 75 MG PO TABS: 75.0000 mg | ORAL_TABLET | Freq: Every day | ORAL | 0 refills | Status: DC

## 2016-04-19 ENCOUNTER — Other Ambulatory Visit: Payer: Self-pay | Admitting: Internal Medicine

## 2016-05-04 ENCOUNTER — Ambulatory Visit: Payer: Medicare Other | Admitting: Internal Medicine

## 2016-05-08 ENCOUNTER — Ambulatory Visit (INDEPENDENT_AMBULATORY_CARE_PROVIDER_SITE_OTHER): Payer: Medicare HMO | Admitting: Internal Medicine

## 2016-05-08 ENCOUNTER — Encounter: Payer: Self-pay | Admitting: Internal Medicine

## 2016-05-08 VITALS — BP 160/94 | HR 60 | Temp 97.6°F | Ht 69.0 in | Wt 198.0 lb

## 2016-05-08 DIAGNOSIS — I10 Essential (primary) hypertension: Secondary | ICD-10-CM

## 2016-05-08 DIAGNOSIS — C61 Malignant neoplasm of prostate: Secondary | ICD-10-CM

## 2016-05-08 DIAGNOSIS — R609 Edema, unspecified: Secondary | ICD-10-CM | POA: Diagnosis not present

## 2016-05-08 DIAGNOSIS — I639 Cerebral infarction, unspecified: Secondary | ICD-10-CM | POA: Diagnosis not present

## 2016-05-08 DIAGNOSIS — E785 Hyperlipidemia, unspecified: Secondary | ICD-10-CM

## 2016-05-08 MED ORDER — LISINOPRIL 10 MG PO TABS: 10.0000 mg | ORAL_TABLET | Freq: Every day | ORAL | 1 refills | Status: DC

## 2016-05-08 MED ORDER — ATORVASTATIN CALCIUM 80 MG PO TABS: 80.0000 mg | ORAL_TABLET | Freq: Every day | ORAL | 3 refills | Status: DC

## 2016-05-08 MED ORDER — CLOPIDOGREL BISULFATE 75 MG PO TABS: 75.0000 mg | ORAL_TABLET | Freq: Every day | ORAL | 3 refills | Status: DC
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
## 2016-05-09 ENCOUNTER — Other Ambulatory Visit: Payer: Self-pay | Admitting: *Deleted

## 2016-05-10 ENCOUNTER — Other Ambulatory Visit: Payer: Self-pay | Admitting: *Deleted

## 2016-05-10 DIAGNOSIS — I10 Essential (primary) hypertension: Secondary | ICD-10-CM

## 2016-05-10 DIAGNOSIS — E785 Hyperlipidemia, unspecified: Secondary | ICD-10-CM

## 2016-05-10 DIAGNOSIS — R609 Edema, unspecified: Secondary | ICD-10-CM

## 2016-05-11 ENCOUNTER — Other Ambulatory Visit: Payer: Self-pay | Admitting: *Deleted

## 2016-05-11 ENCOUNTER — Encounter: Payer: Self-pay | Admitting: Radiation Oncology

## 2016-05-11 ENCOUNTER — Ambulatory Visit
Admission: RE | Admit: 2016-05-11 | Discharge: 2016-05-11 | Disposition: A | Discharge status: Home / Self Care | Payer: Medicare HMO | Source: Ambulatory Visit | Attending: Radiation Oncology | Admitting: Radiation Oncology

## 2016-05-11 ENCOUNTER — Inpatient Hospital Stay: Payer: Medicare HMO | Attending: Radiation Oncology

## 2016-05-11 VITALS — BP 177/94 | HR 62 | Temp 96.4°F | Wt 198.7 lb

## 2016-05-11 DIAGNOSIS — C61 Malignant neoplasm of prostate: Secondary | ICD-10-CM

## 2016-05-11 DIAGNOSIS — Z923 Personal history of irradiation: Secondary | ICD-10-CM | POA: Diagnosis not present

## 2016-05-11 DIAGNOSIS — E785 Hyperlipidemia, unspecified: Secondary | ICD-10-CM

## 2016-05-11 DIAGNOSIS — I10 Essential (primary) hypertension: Secondary | ICD-10-CM

## 2016-05-11 DIAGNOSIS — R609 Edema, unspecified: Secondary | ICD-10-CM

## 2016-05-11 LAB — COMPREHENSIVE METABOLIC PANEL
ALT: 19 U/L (ref 17–63)
AST: 23 U/L (ref 15–41)
Albumin: 3.7 g/dL (ref 3.5–5.0)
Alkaline Phosphatase: 73 U/L (ref 38–126)
Anion gap: 3 — ABNORMAL LOW (ref 5–15)
BUN: 17 mg/dL (ref 6–20)
CHLORIDE: 107 mmol/L (ref 101–111)
CO2: 27 mmol/L (ref 22–32)
Calcium: 9 mg/dL (ref 8.9–10.3)
Creatinine, Ser: 0.87 mg/dL (ref 0.61–1.24)
Glucose, Bld: 100 mg/dL — ABNORMAL HIGH (ref 65–99)
POTASSIUM: 4.3 mmol/L (ref 3.5–5.1)
Sodium: 137 mmol/L (ref 135–145)
Total Bilirubin: 0.7 mg/dL (ref 0.3–1.2)
Total Protein: 6.7 g/dL (ref 6.5–8.1)

## 2016-05-11 LAB — LIPID PANEL
CHOL/HDL RATIO: 2.6 ratio
CHOLESTEROL: 110 mg/dL (ref 0–200)
HDL: 43 mg/dL (ref >40)
LDL Cholesterol: 56 mg/dL (ref 0–99)
TRIGLYCERIDES: 57 mg/dL (ref <150)
VLDL: 11 mg/dL (ref 0–40)

## 2016-05-11 LAB — CBC WITH DIFFERENTIAL/PLATELET
Basophils Absolute: 0 10*3/uL (ref 0–0.1)
Basophils Relative: 1 %
EOS ABS: 0.1 10*3/uL (ref 0–0.7)
EOS PCT: 3 %
HCT: 43.2 % (ref 40.0–52.0)
HEMOGLOBIN: 14.7 g/dL (ref 13.0–18.0)
LYMPHS ABS: 0.8 10*3/uL — AB (ref 1.0–3.6)
Lymphocytes Relative: 22 %
MCH: 32.7 pg (ref 26.0–34.0)
MCHC: 34.1 g/dL (ref 32.0–36.0)
MCV: 95.8 fL (ref 80.0–100.0)
MONOS PCT: 7 %
Monocytes Absolute: 0.3 10*3/uL (ref 0.2–1.0)
NEUTROS PCT: 67 %
Neutro Abs: 2.5 10*3/uL (ref 1.4–6.5)
Platelets: 150 10*3/uL (ref 150–440)
RBC: 4.51 MIL/uL (ref 4.40–5.90)
RDW: 13.5 % (ref 11.5–14.5)
WBC: 3.7 10*3/uL — ABNORMAL LOW (ref 3.8–10.6)

## 2016-05-11 LAB — PSA: PSA: 0.05 ng/mL (ref 0.00–4.00)

## 2016-05-11 LAB — TSH: TSH: 1.341 u[IU]/mL (ref 0.350–4.500)

## 2016-05-11 NOTE — Progress Notes (Signed)
Radiation Oncology Follow up Note  Name: [name]   Date:   05/11/2016 MRN:  [id number] DOB: 05-Jul-1935    This 81 y.o. male presents to the clinic today for today for follow-up status post radiation therapy for Gleason 7 adenocarcinoma the prostate presenting with a PSA of 12 stage IIa disease.  REFERRING PROVIDER: Glean Hess, MD  HPI: patient is a 81 year old male now out 2 and half years having completed IM RT radiation to his prostate and pelvic nodes for Gleason 7 (4+3) presenting the PSA of 12. He is seen today in routine follow-up and is doing well. He specifically denies diarrhea dysuria or any increased lower urinary tract symptoms last PSA 6 months prior was 0.1.  COMPLICATIONS OF TREATMENT: none  FOLLOW UP COMPLIANCE: keeps appointments   PHYSICAL EXAM:  BP (!) 177/94   Pulse 62   Temp (!) 96.4 F (35.8 C)   Wt 198 lb 11.9 oz (90.2 kg)   BMI 29.35 kg/m  On rectal exam rectal sphincter tone is good. Prostate is smooth contracted without evidence of nodularity or mass. Sulcus is preserved bilaterally. No discrete nodularity is identified. No other rectal abnormalities are noted. Well-developed well-nourished patient in NAD. HEENT reveals PERLA, EOMI, discs not visualized.  Oral cavity is clear. No oral mucosal lesions are identified. Neck is clear without evidence of cervical or supraclavicular adenopathy. Lungs are clear to A&P. Cardiac examination is essentially unremarkable with regular rate and rhythm without murmur rub or thrill. Abdomen is benign with no organomegaly or masses noted. Motor sensory and DTR levels are equal and symmetric in the upper and lower extremities. Cranial nerves II through XII are grossly intact. Proprioception is intact. No peripheral adenopathy or edema is identified. No motor or sensory levels are noted. Crude visual fields are within normal range.  RADIOLOGY RESULTS: no current films for review  PLAN: present time patient is doing  well I've run a PSA level today and will report that separately. Otherwise I'm please was overall progress. Patient knows to call with any concerns. Will run a PSA at that time also as well as reported on his PSA which was drawn today.  I would like to take this opportunity to thank you for allowing me to participate in the care of your patient.Armstead Peaks., MD

## 2016-07-06 ENCOUNTER — Ambulatory Visit: Payer: Medicare HMO | Admitting: Internal Medicine

## 2016-07-11 ENCOUNTER — Ambulatory Visit: Payer: Medicare HMO | Admitting: Internal Medicine

## 2016-07-14 ENCOUNTER — Encounter: Payer: Self-pay | Admitting: Internal Medicine

## 2016-07-14 ENCOUNTER — Ambulatory Visit (INDEPENDENT_AMBULATORY_CARE_PROVIDER_SITE_OTHER): Payer: Medicare HMO | Admitting: Internal Medicine

## 2016-07-14 VITALS — BP 130/78 | HR 68 | Resp 16 | Ht 69.0 in | Wt 201.3 lb

## 2016-07-14 DIAGNOSIS — I10 Essential (primary) hypertension: Secondary | ICD-10-CM | POA: Insufficient documentation

## 2016-07-14 DIAGNOSIS — I639 Cerebral infarction, unspecified: Secondary | ICD-10-CM

## 2016-07-14 DIAGNOSIS — J302 Other seasonal allergic rhinitis: Secondary | ICD-10-CM | POA: Insufficient documentation

## 2016-07-14 DIAGNOSIS — E785 Hyperlipidemia, unspecified: Secondary | ICD-10-CM | POA: Diagnosis not present

## 2016-07-14 NOTE — Progress Notes (Signed)
Date:  07/14/2016   Name:  [name]   DOB:  1936-02-09   MRN:  [id number]   Chief Complaint: Hypertension (130/68 Monday 170/71 today ) Hypertension  This is a new problem. The problem has been gradually improving since onset. Pertinent negatives include no anxiety, headaches, malaise/fatigue, palpitations, peripheral edema or shortness of breath. Past treatments include alpha 1 blockers (started last visit). The current treatment provides significant improvement. There are no compliance problems.      Review of Systems  Constitutional: Negative for chills, fatigue, fever and malaise/fatigue.  Eyes: Negative for visual disturbance.  Respiratory: Negative for cough, chest tightness, shortness of breath and wheezing.   Cardiovascular: Negative for palpitations.  Gastrointestinal: Negative for abdominal pain and diarrhea.  Neurological: Negative for dizziness and headaches.    Patient Active Problem List   Diagnosis Date Noted  . Seasonal allergies 07/14/2016  . Adenocarcinoma of prostate ([place]) 11/23/2014  . Dyslipidemia 09/03/2014  . Edema extremities 09/03/2014  . Cerebrovascular accident, late effects 04/23/2012  . Cerebrovascular accident (CVA) ([place]) 03/31/2012    Prior to Admission medications   Medication Sig Start Date End Date Taking? Authorizing Provider  aspirin 81 MG tablet Take 1 tablet by mouth daily.   Yes Historical Provider, MD  atorvastatin (LIPITOR) 80 MG tablet Take 1 tablet (80 mg total) by mouth daily. 05/08/16  Yes Glean Hess, MD  cholecalciferol (VITAMIN D) 400 UNITS TABS tablet Take 400 Units by mouth. Reported on 05/06/2015   Yes Historical Provider, MD  clopidogrel (PLAVIX) 75 MG tablet Take 1 tablet (75 mg total) by mouth daily. 05/08/16  Yes Glean Hess, MD  Leuprolide Acetate (LUPRON IJ) Inject as directed. Reported on 10/12/2015   Yes Historical Provider, MD  lisinopril (PRINIVIL,ZESTRIL) 10 MG tablet Take 1 tablet (10 mg total) by mouth  daily. 05/08/16  Yes Glean Hess, MD  vitamin B-12 (CYANOCOBALAMIN) 1000 MCG tablet Take 1,000 mcg by mouth daily.   Yes Historical Provider, MD    No Known Allergies  Past Surgical History:  Procedure Laterality Date  . INGUINAL HERNIA REPAIR Left     Social History  Substance Use Topics  . Smoking status: Former Smoker    Types: Cigarettes    Quit date: 05/02/2011  . Smokeless tobacco: Never Used  . Alcohol use No     Medication list has been reviewed and updated.   Physical Exam  Constitutional: He is oriented to person, place, and time. He appears well-developed. No distress.  HENT:  Head: Normocephalic and atraumatic.  Neck: Normal range of motion. Neck supple. Carotid bruit is not present.  Cardiovascular: Normal rate, regular rhythm and normal heart sounds.   Pulmonary/Chest: Effort normal and breath sounds normal. No respiratory distress.  Neurological: He is alert and oriented to person, place, and time.  Skin: Skin is warm and dry. No rash noted.  Psychiatric: He has a normal mood and affect. His behavior is normal. Thought content normal.  Nursing note and vitals reviewed.   BP 132/80   Pulse 68   Resp 16   Ht 5\' 9"  (1.753 m)   Wt 201 lb 4.8 oz (91.3 kg)   SpO2 98%   BMI 29.73 kg/m   Assessment and Plan: 1. Essential hypertension Controlled now on ACEI  2. Cerebrovascular accident (CVA), unspecified mechanism ([place]) Stable, continue ASA and Plavix  3. Dyslipidemia Continue lipitor   No orders of the defined types were placed in this encounter.   Mickel Baas  Army Melia, [facility]  07/14/2016

## 2016-11-06 ENCOUNTER — Other Ambulatory Visit: Payer: Self-pay

## 2016-11-06 ENCOUNTER — Other Ambulatory Visit: Payer: Self-pay | Admitting: Internal Medicine

## 2016-11-06 DIAGNOSIS — I10 Essential (primary) hypertension: Secondary | ICD-10-CM

## 2016-11-06 DIAGNOSIS — I639 Cerebral infarction, unspecified: Secondary | ICD-10-CM

## 2016-11-06 DIAGNOSIS — R609 Edema, unspecified: Secondary | ICD-10-CM

## 2016-11-06 MED ORDER — LISINOPRIL 10 MG PO TABS: 10.0000 mg | ORAL_TABLET | Freq: Every day | ORAL | 1 refills | Status: DC

## 2016-11-06 MED ORDER — CLOPIDOGREL BISULFATE 75 MG PO TABS: 75.0000 mg | ORAL_TABLET | Freq: Every day | ORAL | 1 refills | Status: DC

## 2016-11-06 NOTE — Telephone Encounter (Signed)
Pt's wife called and stated pt needs refills on lisinopril and Plavix. States insurance has called [place] but we have no responded. I have not received any calls about these refills prior to this. Please advise.

## 2016-11-10 ENCOUNTER — Other Ambulatory Visit: Payer: Self-pay | Admitting: Internal Medicine

## 2016-11-10 DIAGNOSIS — I10 Essential (primary) hypertension: Secondary | ICD-10-CM

## 2016-11-10 DIAGNOSIS — R609 Edema, unspecified: Secondary | ICD-10-CM

## 2016-11-13 NOTE — Progress Notes (Signed)
11/14/2016 11:37 AM   [name] 01/31/1936 [id number]  Referring provider: Glean Hess, MD [address] [place], [place] [address]  Chief Complaint  Patient presents with  . Prostate Cancer    1 year follow up   . Benign Prostatic Hypertrophy  . Hematuria    HPI: 81 yo WM with a history of hematuria and prostate cancer who presents today for a one year follow up.  History of hematuria Patient experienced gross hematuria after he [place] a tumble [place] the golf [place] last year.  CTU and cystoscopy and no malignancies were discovered.  He has not experienced any further gross hematuria since his last visit with [place]. His UA today Is unremarkable.    History of prostate cancer  Patient is over one year out having completed IM RT radiation therapy to prostate and pelvic nodes for a Gleason 7 (4+3) presenting the PSA of 12.  Last PSA was 0.05 on 05/11/2016.Marland Kitchen  Patient having frequency, urgency, nocturia and incontinence.  His I PSS today is 14/2.  His PVR is 0 mL.  His previous I PSS score was 23/2.        IPSS    Row Name 11/14/16 1100         International Prostate Symptom Score   How often have you had the sensation of not emptying your bladder? Less than half the time     How often have you had to urinate less than every two hours? Less than half the time     How often have you found you stopped and started again several times when you urinated? Less than half the time     How often have you found it difficult to postpone urination? Less than half the time     How often have you had a weak urinary stream? Less than half the time     How often have you had to strain to start urination? Less than half the time     How many times did you typically get up at night to urinate? 2 Times     Total IPSS Score 14       Quality of Life due to urinary symptoms   If you were to spend the rest of your life with your urinary condition just the way it is now how would you  feel about that? Mostly Satisfied       Score:  1-7 Mild 8-19 Moderate 20-35 Severe    PMH: Past Medical History:  Diagnosis Date  . Edema extremities   . Elevated PSA   . HLD (hyperlipidemia)   . Hypertension   . Kidney stones   . Prostate cancer [facility]     Surgical History: Past Surgical History:  Procedure Laterality Date  . INGUINAL HERNIA REPAIR Left     Home Medications:  Allergies as of 11/14/2016   No Known Allergies     Medication List       Accurate as of 11/14/16 11:37 AM. Always use your most recent med list.          aspirin 81 MG tablet Take 1 tablet by mouth daily.   atorvastatin 80 MG tablet Commonly known as:  LIPITOR Take 1 tablet (80 mg total) by mouth daily.   cholecalciferol 400 units Tabs tablet Commonly known as:  VITAMIN D Take 400 Units by mouth. Reported on 05/06/2015   clopidogrel 75 MG tablet Commonly known as:  PLAVIX Take 1 tablet (  75 mg total) by mouth daily.   lisinopril 10 MG tablet Commonly known as:  PRINIVIL,ZESTRIL Take 1 tablet (10 mg total) by mouth daily.   lisinopril 10 MG tablet Commonly known as:  PRINIVIL,ZESTRIL TAKE 1 TABLET DAILY   vitamin B-12 1000 MCG tablet Commonly known as:  CYANOCOBALAMIN Take 1,000 mcg by mouth daily.       Allergies: No Known Allergies  Family History: Family History  Problem Relation Age of Onset  . Parkinson's disease Mother   . Stroke Brother   . Prostate cancer Brother   . Kidney disease Neg Hx   . Kidney cancer Neg Hx   . Bladder Cancer Neg Hx     Social History:  reports that he quit smoking about 5 years ago. His smoking use included Cigarettes. He has never used smokeless tobacco. He reports that he does not drink alcohol or use drugs.  ROS: UROLOGY Frequent Urination?: Yes Hard to postpone urination?: Yes Burning/pain with urination?: No Get up at night to urinate?: Yes Leakage of urine?: Yes Urine stream starts and stops?: No Trouble starting  stream?: No Do you have to strain to urinate?: No Blood in urine?: No Urinary tract infection?: No Sexually transmitted disease?: No Injury to kidneys or bladder?: No Painful intercourse?: No Weak stream?: No Erection problems?: No Penile pain?: No  Gastrointestinal Nausea?: No Vomiting?: No Indigestion/heartburn?: No Diarrhea?: No Constipation?: No  Constitutional Fever: No Night sweats?: No Weight loss?: No Fatigue?: No  Skin Skin rash/lesions?: No Itching?: No  Eyes Blurred vision?: No Double vision?: No  Ears/Nose/Throat Sore throat?: No Sinus problems?: No  Hematologic/Lymphatic Swollen glands?: No Easy bruising?: No  Cardiovascular Leg swelling?: No Chest pain?: No  Respiratory Cough?: No Shortness of breath?: Yes  Endocrine Excessive thirst?: No  Musculoskeletal Back pain?: No Joint pain?: No  Neurological Headaches?: No Dizziness?: No  Psychologic Depression?: No Anxiety?: No  Physical Exam: BP (!) 163/92   Pulse (!) 48   Ht 5\' 9"  (1.753 m)   Wt 196 lb 8 oz (89.1 kg)   BMI 29.02 kg/m   Constitutional: Well nourished. Alert and oriented, No acute distress. HEENT: [place] AT, moist mucus membranes. Trachea midline, no masses. Cardiovascular: No clubbing, cyanosis, or edema. Respiratory: Normal respiratory effort, no increased work of breathing.  Left ribs clicking on exam.  Tender on palpation.   GI: Abdomen is soft, non tender, non distended, no abdominal masses. Liver and spleen not palpable.  No hernias appreciated.  Stool sample for occult testing is not indicated.   GU: No CVA tenderness.  No flank ecchymosis.  No bladder fullness or masses.  Patient with uncircumcised phallus. Foreskin easily retracted  Urethral meatus is patent.  No penile discharge. No penile lesions or rashes. Scrotum without lesions, cysts, rashes and/or edema.  Testicles are located scrotally bilaterally. No masses are appreciated in the testicles. Left and  right epididymis are normal. Rectal: Patient with  normal sphincter tone. Anus and perineum without scarring or rashes. No rectal masses are appreciated. Prostate is approximately 45 grams, small nodule in right base are appreciated. Seminal vesicles are normal. Skin: No rashes, bruises or suspicious lesions. Lymph: No cervical or inguinal adenopathy. Neurologic: Grossly intact, no focal deficits, moving all 4 extremities. Psychiatric: Normal mood and affect.  Laboratory Data: Lab Results  Component Value Date   WBC 3.7 (L) 05/11/2016   HGB 14.7 05/11/2016   HCT 43.2 05/11/2016   MCV 95.8 05/11/2016   PLT 150 05/11/2016  Lab Results  Component Value Date   CREATININE 0.87 05/11/2016    Lab Results  Component Value Date   PSA 0.05 05/11/2016   PSA 0.04 05/06/2015   PSA 0.09 10/29/2014     Lab Results  Component Value Date   TSH 1.341 05/11/2016       Component Value Date/Time   CHOL 110 05/11/2016 0941   CHOL 108 11/23/2014 1117   CHOL 162 04/25/2012 0358   HDL 43 05/11/2016 0941   HDL 44 11/23/2014 1117   HDL 30 (L) 04/25/2012 0358   CHOLHDL 2.6 05/11/2016 0941   VLDL 11 05/11/2016 0941   VLDL 19 04/25/2012 0358   LDLCALC 56 05/11/2016 0941   LDLCALC 48 11/23/2014 1117   LDLCALC 113 (H) 04/25/2012 0358    Lab Results  Component Value Date   AST 23 05/11/2016   Lab Results  Component Value Date   ALT 19 05/11/2016   Urinalysis Unremarkable.  See EPIC.   I have reviewed the labs  Pertinent imaging    Assessment & Plan:    1. History of hematuria  - work up completed 2017 with CTU and cystoscopy - no malignancies discovered  - Urinalysis, Complete - no hematuria  - patient will report any gross hematuria  2. History of prostate cancer  - stage IIa (T2 CN 0 M0 presenting with a Gleason score of 7 (4+3) and a PSA of 12 - S/P IM RT completed in 05/2014  - current PSA 0.05  - follow up with Dr. Donella Stade in 05/2017  3. LU TS  - IPSS score is  14/2, it is improving    Return in about 1 year (around 11/14/2017) for I PSS, UA and exam.  These notes generated with voice recognition software. I apologize for typographical errors.  Zara Council, [facility] [address] [place], [place] [address] [phone]

## 2016-11-14 ENCOUNTER — Encounter: Payer: Self-pay | Admitting: Urology

## 2016-11-14 ENCOUNTER — Ambulatory Visit (INDEPENDENT_AMBULATORY_CARE_PROVIDER_SITE_OTHER): Payer: Medicare HMO | Admitting: Urology

## 2016-11-14 VITALS — BP 163/92 | HR 48 | Ht 69.0 in | Wt 196.5 lb

## 2016-11-14 DIAGNOSIS — C61 Malignant neoplasm of prostate: Secondary | ICD-10-CM | POA: Diagnosis not present

## 2016-11-14 DIAGNOSIS — R399 Unspecified symptoms and signs involving the genitourinary system: Secondary | ICD-10-CM | POA: Diagnosis not present

## 2016-11-14 DIAGNOSIS — Z87448 Personal history of other diseases of urinary system: Secondary | ICD-10-CM

## 2016-11-14 LAB — URINALYSIS, COMPLETE
BILIRUBIN UA: NEGATIVE
Glucose, UA: NEGATIVE
KETONES UA: NEGATIVE
LEUKOCYTES UA: NEGATIVE
NITRITE UA: NEGATIVE
UUROB: 0.2 mg/dL (ref 0.2–1.0)
pH, UA: 5 (ref 5.0–7.5)

## 2016-11-14 LAB — MICROSCOPIC EXAMINATION
Epithelial Cells (non renal): NONE SEEN /hpf (ref 0–10)
RBC MICROSCOPIC, UA: NONE SEEN /HPF (ref 0–?)
WBC, UA: NONE SEEN /hpf (ref 0–?)

## 2016-11-14 LAB — BLADDER SCAN AMB NON-IMAGING: Scan Result: 27

## 2016-12-12 ENCOUNTER — Encounter: Payer: Self-pay | Admitting: Internal Medicine

## 2016-12-12 ENCOUNTER — Ambulatory Visit (INDEPENDENT_AMBULATORY_CARE_PROVIDER_SITE_OTHER): Payer: Medicare HMO | Admitting: Internal Medicine

## 2016-12-12 VITALS — BP 136/76 | HR 55 | Ht 69.0 in | Wt 196.6 lb

## 2016-12-12 DIAGNOSIS — I700 Atherosclerosis of aorta: Secondary | ICD-10-CM | POA: Insufficient documentation

## 2016-12-12 DIAGNOSIS — G451 Carotid artery syndrome (hemispheric): Secondary | ICD-10-CM | POA: Diagnosis not present

## 2016-12-12 DIAGNOSIS — I10 Essential (primary) hypertension: Secondary | ICD-10-CM | POA: Diagnosis not present

## 2016-12-12 DIAGNOSIS — R011 Cardiac murmur, unspecified: Secondary | ICD-10-CM | POA: Diagnosis not present

## 2016-12-12 DIAGNOSIS — I350 Nonrheumatic aortic (valve) stenosis: Secondary | ICD-10-CM | POA: Diagnosis not present

## 2016-12-12 DIAGNOSIS — Z8673 Personal history of transient ischemic attack (TIA), and cerebral infarction without residual deficits: Secondary | ICD-10-CM

## 2016-12-12 DIAGNOSIS — E785 Hyperlipidemia, unspecified: Secondary | ICD-10-CM

## 2016-12-12 DIAGNOSIS — E7800 Pure hypercholesterolemia, unspecified: Secondary | ICD-10-CM | POA: Diagnosis not present

## 2016-12-12 NOTE — Progress Notes (Signed)
Date:  12/12/2016   Name:  [name]   DOB:  14-Mar-1936   MRN:  [id number]   Chief Complaint: Hypertension; Cerebrovascular Accident; and Hyperlipidemia Hypertension  This is a chronic problem. The problem is controlled. Pertinent negatives include no chest pain, headaches, palpitations or shortness of breath. Past treatments include ACE inhibitors.  Cerebrovascular Accident  The current episode started more than 1 year ago. The problem has been resolved (2013 - on Plavix). Pertinent negatives include no chest pain, chills, coughing, fatigue, fever, headaches or weakness.  Hyperlipidemia  This is a chronic problem. Pertinent negatives include no chest pain or shortness of breath. Current antihyperlipidemic treatment includes statins. The current treatment provides significant improvement of lipids.  He had an episode a month ago - was outside grilling hot dogs at [employer], became lightheaded and confused.  EMT came - BP was high and he appeared overheated.  He drank water and cooled down and felt fine within an hour.  No chest pains, focal neurological sx and no further sx.   He has been a bit more fatigued cutting grass recently but still plays golf and has not noticed decreased endurance. Lab Results  Component Value Date   CHOL 110 05/11/2016   HDL 43 05/11/2016   LDLCALC 56 05/11/2016   TRIG 57 05/11/2016   CHOLHDL 2.6 05/11/2016   Lab Results  Component Value Date   CREATININE 0.87 05/11/2016      Review of Systems  Constitutional: Negative for chills, fatigue and fever.  Respiratory: Negative for cough, chest tightness, shortness of breath and wheezing.   Cardiovascular: Negative for chest pain, palpitations and leg swelling.  Genitourinary: Negative for hematuria.  Neurological: Negative for dizziness, tremors, weakness and headaches.    Patient Active Problem List   Diagnosis Date Noted  . Seasonal allergies 07/14/2016  . Hypertension 07/14/2016  . Adenocarcinoma  of prostate ([place]) 11/23/2014  . Dyslipidemia 09/03/2014  . Edema extremities 09/03/2014  . Cerebrovascular accident, late effects 04/23/2012  . History of CVA (cerebrovascular accident) without residual deficits 03/31/2012    Prior to Admission medications   Medication Sig Start Date End Date Taking? Authorizing Provider  aspirin 81 MG tablet Take 1 tablet by mouth daily.   Yes [provider]  atorvastatin (LIPITOR) 80 MG tablet Take 1 tablet (80 mg total) by mouth daily. 05/08/16  Yes Glean Hess, MD  cholecalciferol (VITAMIN D) 400 UNITS TABS tablet Take 400 Units by mouth. Reported on 05/06/2015   Yes [provider]  clopidogrel (PLAVIX) 75 MG tablet Take 1 tablet (75 mg total) by mouth daily. 11/06/16  Yes Glean Hess, MD  lisinopril (PRINIVIL,ZESTRIL) 10 MG tablet TAKE 1 TABLET DAILY 11/10/16  Yes Glean Hess, MD  vitamin B-12 (CYANOCOBALAMIN) 1000 MCG tablet Take 1,000 mcg by mouth daily.   Yes [provider]    No Known Allergies  Past Surgical History:  Procedure Laterality Date  . INGUINAL HERNIA REPAIR Left     Social History  Substance Use Topics  . Smoking status: Former Smoker    Types: Cigarettes    Quit date: 05/02/2011  . Smokeless tobacco: Never Used  . Alcohol use No     Medication list has been reviewed and updated.   Physical Exam  Constitutional: He is oriented to person, place, and time. He appears well-developed. No distress.  HENT:  Head: Normocephalic and atraumatic.  Neck: Normal range of motion. Neck supple. No JVD present.  Cardiovascular: Normal  rate and regular rhythm.   No extrasystoles are present. Exam reveals no gallop, no friction rub and no decreased pulses.   Murmur heard.  Systolic murmur is present with a grade of 4/6  holo-systolic and loudest at the apex and radiates across the upper chest  Pulmonary/Chest: Effort normal and breath sounds normal. No respiratory distress. He has no wheezes.    Musculoskeletal: Normal range of motion. He exhibits edema (trace). He exhibits no tenderness.  Neurological: He is alert and oriented to person, place, and time.  Skin: Skin is warm and dry. No rash noted.  Psychiatric: He has a normal mood and affect. His behavior is normal. Thought content normal.  Nursing note and vitals reviewed.   BP 136/76   Pulse (!) 55   Ht 5\' 9"  (1.753 m)   Wt 196 lb 9.6 oz (89.2 kg)   SpO2 96%   BMI 29.03 kg/m   Assessment and Plan: 1. Essential hypertension controlled - Ambulatory referral to Cardiology  2. Dyslipidemia On statin therapy  3. History of CVA (cerebrovascular accident) without residual deficits No recurrence  4. Newly recognized heart murmur No obvious change in energy, no JVD, no SOB - Ambulatory referral to Cardiology   No orders of the defined types were placed in this encounter.   Halina Maidens, MD [facility]  12/12/2016

## 2016-12-18 ENCOUNTER — Ambulatory Visit: Payer: Medicare HMO | Admitting: Cardiovascular Disease

## 2017-01-08 ENCOUNTER — Ambulatory Visit: Payer: Medicare HMO

## 2017-01-08 DIAGNOSIS — G451 Carotid artery syndrome (hemispheric): Secondary | ICD-10-CM | POA: Diagnosis not present

## 2017-01-08 DIAGNOSIS — I350 Nonrheumatic aortic (valve) stenosis: Secondary | ICD-10-CM | POA: Diagnosis not present

## 2017-01-08 DIAGNOSIS — E7800 Pure hypercholesterolemia, unspecified: Secondary | ICD-10-CM | POA: Diagnosis not present

## 2017-01-08 DIAGNOSIS — R001 Bradycardia, unspecified: Secondary | ICD-10-CM | POA: Diagnosis not present

## 2017-01-08 DIAGNOSIS — I6523 Occlusion and stenosis of bilateral carotid arteries: Secondary | ICD-10-CM | POA: Diagnosis not present

## 2017-01-08 DIAGNOSIS — I10 Essential (primary) hypertension: Secondary | ICD-10-CM | POA: Diagnosis not present

## 2017-02-12 ENCOUNTER — Ambulatory Visit: Payer: Medicare HMO

## 2017-02-12 VITALS — BP 120/70 | HR 72 | Temp 98.6°F | Resp 16 | Ht 69.0 in | Wt 199.2 lb

## 2017-02-12 DIAGNOSIS — Z0000 Encounter for general adult medical examination without abnormal findings: Secondary | ICD-10-CM

## 2017-02-12 NOTE — Patient Instructions (Signed)
[name] , Thank you for taking time to come for your Medicare Wellness Visit. I appreciate your ongoing commitment to your health goals. Please review the following plan we discussed and let me know if I can assist you in the future.   Screening recommendations/referrals: Colonoscopy: Declined Recommended yearly ophthalmology/optometry visit for glaucoma screening and checkup Recommended yearly dental visit for hygiene and checkup  Vaccinations: Influenza vaccine: Declined Pneumococcal vaccine: Declined Tdap vaccine: Declined. Please call your insurance company to determine your out of pocket expense. Shingles vaccine: Declined. Please call your insurance company to determine your out of pocket expense.    Advanced directives: Please bring a copy of your POA (Power of Attorney) and/or Living Will to your next appointment.   Conditions/risks identified: Fall risk reduction discussed  Next appointment: Please schedule a follow up appointment with Dr. Army Melia prior to leaving the office today. Please schedule your annual wellness exam in one year.  Preventive Care 79 Years and Older, Male Preventive care refers to lifestyle choices and visits with your health care provider that can promote health and wellness. What does preventive care include?  A yearly physical exam. This is also called an annual well check.  Dental exams once or twice a year.  Routine eye exams. Ask your health care provider how often you should have your eyes checked.  Personal lifestyle choices, including:  Daily care of your teeth and gums.  Regular physical activity.  Eating a healthy diet.  Avoiding tobacco and drug use.  Limiting alcohol use.  Practicing safe sex.  Taking low doses of aspirin every day.  Taking vitamin and mineral supplements as recommended by your health care provider. What happens during an annual well check? The services and screenings done by your health care provider  during your annual well check will depend on your age, overall health, lifestyle risk factors, and family history of disease. Counseling  Your health care provider may ask you questions about your:  Alcohol use.  Tobacco use.  Drug use.  Emotional well-being.  Home and relationship well-being.  Sexual activity.  Eating habits.  History of falls.  Memory and ability to understand (cognition).  Work and work Statistician. Screening  You may have the following tests or measurements:  Height, weight, and BMI.  Blood pressure.  Lipid and cholesterol levels. These may be checked every 5 years, or more frequently if you are over 70 years old.  Skin check.  Lung cancer screening. You may have this screening every year starting at age 79 if you have a 30-pack-year history of smoking and currently smoke or have quit within the past 15 years.  Fecal occult blood test (FOBT) of the stool. You may have this test every year starting at age 79.  Flexible sigmoidoscopy or colonoscopy. You may have a sigmoidoscopy every 5 years or a colonoscopy every 10 years starting at age 79.  Prostate cancer screening. Recommendations will vary depending on your family history and other risks.  Hepatitis C blood test.  Hepatitis B blood test.  Sexually transmitted disease (STD) testing.  Diabetes screening. This is done by checking your blood sugar (glucose) after you have not eaten for a while (fasting). You may have this done every 1-3 years.  Abdominal aortic aneurysm (AAA) screening. You may need this if you are a current or former smoker.  Osteoporosis. You may be screened starting at age 79 if you are at high risk. Talk with your health care provider about your test  results, treatment options, and if necessary, the need for more tests. Vaccines  Your health care provider may recommend certain vaccines, such as:  Influenza vaccine. This is recommended every year.  Tetanus,  diphtheria, and acellular pertussis (Tdap, Td) vaccine. You may need a Td booster every 10 years.  Zoster vaccine. You may need this after age 79.  Pneumococcal 13-valent conjugate (PCV13) vaccine. One dose is recommended after age 79.  Pneumococcal polysaccharide (PPSV23) vaccine. One dose is recommended after age 79. Talk to your health care provider about which screenings and vaccines you need and how often you need them. This information is not intended to replace advice given to you by your health care provider. Make sure you discuss any questions you have with your health care provider. Document Released: 05/14/2015 Document Revised: 01/05/2016 Document Reviewed: 02/16/2015 Elsevier Interactive Patient Education  2017 [employer] Prevention in the Home Falls can cause injuries. They can happen to people of all ages. There are many things you can do to make your home safe and to help prevent falls. What can I do on the outside of my home?  Regularly fix the edges of walkways and driveways and fix any cracks.  Remove anything that might make you trip as you walk through a door, such as a raised step or threshold.  Trim any bushes or trees on the path to your home.  Use bright outdoor lighting.  Clear any walking paths of anything that might make someone trip, such as rocks or tools.  Regularly check to see if handrails are loose or broken. Make sure that both sides of any steps have handrails.  Any raised decks and porches should have guardrails on the edges.  Have any leaves, snow, or ice cleared regularly.  Use sand or salt on walking paths during winter.  Clean up any spills in your garage right away. This includes oil or grease spills. What can I do in the bathroom?  Use night lights.  Install grab bars by the toilet and in the tub and shower. Do not use towel bars as grab bars.  Use non-skid mats or decals in the tub or shower.  If you need to sit down in  the shower, use a plastic, non-slip stool.  Keep the floor dry. Clean up any water that spills on the floor as soon as it happens.  Remove soap buildup in the tub or shower regularly.  Attach bath mats securely with double-sided non-slip rug tape.  Do not have throw rugs and other things on the floor that can make you trip. What can I do in the bedroom?  Use night lights.  Make sure that you have a light by your bed that is easy to reach.  Do not use any sheets or blankets that are too big for your bed. They should not hang down onto the floor.  Have a firm chair that has side arms. You can use this for support while you get dressed.  Do not have throw rugs and other things on the floor that can make you trip. What can I do in the kitchen?  Clean up any spills right away.  Avoid walking on wet floors.  Keep items that you use a lot in easy-to-reach places.  If you need to reach something above you, use a strong step stool that has a grab bar.  Keep electrical cords out of the way.  Do not use floor polish or wax that makes  floors slippery. If you must use wax, use non-skid floor wax.  Do not have throw rugs and other things on the floor that can make you trip. What can I do with my stairs?  Do not leave any items on the stairs.  Make sure that there are handrails on both sides of the stairs and use them. Fix handrails that are broken or loose. Make sure that handrails are as long as the stairways.  Check any carpeting to make sure that it is firmly attached to the stairs. Fix any carpet that is loose or worn.  Avoid having throw rugs at the top or bottom of the stairs. If you do have throw rugs, attach them to the floor with carpet tape.  Make sure that you have a light switch at the top of the stairs and the bottom of the stairs. If you do not have them, ask someone to add them for you. What else can I do to help prevent falls?  Wear shoes that:  Do not have high  heels.  Have rubber bottoms.  Are comfortable and fit you well.  Are closed at the toe. Do not wear sandals.  If you use a stepladder:  Make sure that it is fully opened. Do not climb a closed stepladder.  Make sure that both sides of the stepladder are locked into place.  Ask someone to hold it for you, if possible.  Clearly mark and make sure that you can see:  Any grab bars or handrails.  First and last steps.  Where the edge of each step is.  Use tools that help you move around (mobility aids) if they are needed. These include:  Canes.  Walkers.  Scooters.  Crutches.  Turn on the lights when you go into a dark area. Replace any light bulbs as soon as they burn out.  Set up your furniture so you have a clear path. Avoid moving your furniture around.  If any of your floors are uneven, fix them.  If there are any pets around you, be aware of where they are.  Review your medicines with your doctor. Some medicines can make you feel dizzy. This can increase your chance of falling. Ask your doctor what other things that you can do to help prevent falls. This information is not intended to replace advice given to you by your health care provider. Make sure you discuss any questions you have with your health care provider. Document Released: 02/11/2009 Document Revised: 09/23/2015 Document Reviewed: 05/22/2014 Elsevier Interactive Patient Education  2017 [employer].

## 2017-02-12 NOTE — Progress Notes (Signed)
Subjective:   [name] is a 81 y.o. male who presents for Medicare Annual/Subsequent preventive examination.  Review of Systems:  N/A Cardiac Risk Factors include: advanced age (>92men, >69 women);dyslipidemia;male gender;hypertension     Objective:    Vitals: BP 120/70 (BP Location: Right Arm, Cuff Size: Normal)   Pulse 72   Temp 98.6 F (37 C) (Oral)   Resp 16   Ht 5\' 9"  (1.753 m)   Wt 199 lb 3.2 oz (90.4 kg)   BMI 29.42 kg/m   Body mass index is 29.42 kg/m.  Tobacco History  Smoking Status  . Former Smoker  . Types: Cigarettes  . Quit date: 05/02/2011  Smokeless Tobacco  . Never Used     Counseling given: Not Answered   Past Medical History:  Diagnosis Date  . Edema extremities   . Elevated PSA   . HLD (hyperlipidemia)   . Hypertension   . Kidney stones   . Prostate cancer [facility])    Past Surgical History:  Procedure Laterality Date  . INGUINAL HERNIA REPAIR Left    Family History  Problem Relation Age of Onset  . Parkinson's disease Mother   . Stroke Brother   . Prostate cancer Brother   . Alzheimer's disease Daughter   . Kidney disease Neg Hx   . Kidney cancer Neg Hx   . Bladder Cancer Neg Hx    History  Sexual Activity  . Sexual activity: Not on file    Outpatient Encounter Prescriptions as of 02/12/2017  Medication Sig  . atorvastatin (LIPITOR) 80 MG tablet Take 1 tablet (80 mg total) by mouth daily.  . cholecalciferol (VITAMIN D) 400 UNITS TABS tablet Take 400 Units by mouth. Reported on 05/06/2015  . clopidogrel (PLAVIX) 75 MG tablet Take 1 tablet (75 mg total) by mouth daily.  Marland Kitchen lisinopril (PRINIVIL,ZESTRIL) 10 MG tablet TAKE 1 TABLET DAILY  . vitamin B-12 (CYANOCOBALAMIN) 1000 MCG tablet Take 1,000 mcg by mouth daily.  Marland Kitchen aspirin 81 MG tablet Take 1 tablet by mouth daily.   No facility-administered encounter medications on file as of 02/12/2017.     Activities of Daily Living In your present state of health, do you have any  difficulty performing the following activities: 02/12/2017 07/14/2016  Hearing? N N  Vision? N N  Difficulty concentrating or making decisions? N N  Walking or climbing stairs? N N  Dressing or bathing? N N  Doing errands, shopping? N N  Preparing Food and eating ? N -  Using the Toilet? N -  In the past six months, have you accidently leaked urine? N -  Do you have problems with loss of bowel control? N -  Managing your Medications? N -  Managing your Finances? N -  Housekeeping or managing your Housekeeping? N -  Some recent data might be hidden    Patient Care Team: Glean Hess, MD as PCP - General (Internal Medicine) Noreene Filbert, MD as Referring Physician (Radiation Oncology) Laneta Simmers as Physician Assistant (Urology)   Assessment:     Exercise Activities and Dietary recommendations Current Exercise Habits: The patient does not participate in regular exercise at present (Does golf 3 times per week), Exercise limited by: None identified  Goals    . Prevent Falls          Recommend to remove items from your home that may cause you to trip or slip      Fall Risk Fall Risk  02/12/2017 07/14/2016  12/28/2015 10/29/2014  Falls in the past year? No No Yes No  Comment - - Emmi Telephone Survey: data to providers prior to load -  Number falls in past yr: - - 1 -  Comment - - Emmi Telephone Survey Actual Response = 1 -  Injury with Fall? - - Yes -   Depression Screen PHQ 2/9 Scores 02/12/2017 07/14/2016 10/29/2014  PHQ - 2 Score 0 0 0    Cognitive Function: declined         There is no immunization history on file for this patient. Screening Tests Health Maintenance  Topic Date Due  . TETANUS/TDAP  03/12/2017 (Originally 05/27/1954)  . PNA vac Low Risk Adult (1 of 2 - PCV13) 01/29/2018 (Originally 05/27/2002)  . INFLUENZA VACCINE  05/08/2021 (Originally 11/29/2016)      Plan:   I have personally reviewed and addressed the Medicare Annual  Wellness questionnaire and have noted the following in the patient's chart:  A. Medical and social history B. Use of alcohol, tobacco or illicit drugs  C. Current medications and supplements D. Functional ability and status E.  Nutritional status F.  Physical activity G. Advance directives H. List of other physicians I.  Hospitalizations, surgeries, and ER visits in previous 12 months J.  [place] such as hearing and vision if needed, cognitive and depression L. Referrals and appointments - none  In addition, I have reviewed and discussed with patient certain preventive protocols, quality metrics, and best practice recommendations. A written personalized care plan for preventive services as well as general preventive health recommendations were provided to patient.  See attached scanned questionnaire for additional information.   Signed,  Aleatha Borer, LPN Nurse Health Advisor   MD Recommendations: Declined colonoscopy. Also declined flu, tetanus, shingles and pneumonia vaccines. Overdue for annual eye exam. States he will establish care next year.

## 2017-04-18 ENCOUNTER — Other Ambulatory Visit: Payer: Self-pay | Admitting: *Deleted

## 2017-04-24 ENCOUNTER — Other Ambulatory Visit: Payer: Self-pay | Admitting: Internal Medicine

## 2017-04-24 DIAGNOSIS — E785 Hyperlipidemia, unspecified: Secondary | ICD-10-CM

## 2017-05-24 ENCOUNTER — Inpatient Hospital Stay: Payer: Medicare HMO | Attending: Radiation Oncology

## 2017-05-24 ENCOUNTER — Other Ambulatory Visit: Payer: Self-pay | Admitting: *Deleted

## 2017-05-24 DIAGNOSIS — C61 Malignant neoplasm of prostate: Secondary | ICD-10-CM | POA: Diagnosis not present

## 2017-05-28 ENCOUNTER — Other Ambulatory Visit: Payer: Self-pay | Admitting: *Deleted

## 2017-05-29 LAB — PROSTATE-SPECIFIC AG, SERUM (LABCORP): Prostate Specific Ag, Serum: <0.1 ng/mL (ref 0.0–4.0)

## 2017-05-31 ENCOUNTER — Other Ambulatory Visit: Payer: Self-pay | Admitting: *Deleted

## 2017-05-31 ENCOUNTER — Inpatient Hospital Stay: Payer: Medicare HMO

## 2017-05-31 ENCOUNTER — Other Ambulatory Visit: Payer: Self-pay

## 2017-05-31 ENCOUNTER — Ambulatory Visit
Admission: RE | Admit: 2017-05-31 | Discharge: 2017-05-31 | Disposition: A | Discharge status: Home / Self Care | Payer: Medicare HMO | Source: Ambulatory Visit | Attending: Radiation Oncology | Admitting: Radiation Oncology

## 2017-05-31 ENCOUNTER — Encounter: Payer: Self-pay | Admitting: Radiation Oncology

## 2017-05-31 VITALS — Temp 97.9°F | Wt 195.1 lb

## 2017-05-31 DIAGNOSIS — C775 Secondary and unspecified malignant neoplasm of intrapelvic lymph nodes: Secondary | ICD-10-CM | POA: Diagnosis not present

## 2017-05-31 DIAGNOSIS — Z923 Personal history of irradiation: Secondary | ICD-10-CM | POA: Diagnosis not present

## 2017-05-31 DIAGNOSIS — C61 Malignant neoplasm of prostate: Secondary | ICD-10-CM | POA: Diagnosis not present

## 2017-05-31 NOTE — Progress Notes (Signed)
Radiation Oncology Follow up Note  Name: [name]   Date:   05/31/2017 MRN:  [id number] DOB: Oct 27, 1935    This 82 y.o. male presents to the clinic today for 3.5-year follow-up status post IM RT radiation therapy to his prostate and pelvic nodes for Gleason 7 (4+3) adenocarcinoma.Marland Kitchen  REFERRING PROVIDER: Glean Hess, MD  HPI: Patient is an 82 year old male now seen now 3.5 years having completed IM RT radiation therapy to his prostate and pelvic nodes for Gleason 7 adenocarcinoma the prostate presenting with a PSA of 12.  He is seen today in routine follow-up and is doing well.  His most recent PSA remains less than 0.1.  He specifically denies diarrhea or any increased lower urinary tract symptoms.  He has nocturia x2.Marland Kitchen  COMPLICATIONS OF TREATMENT: none  FOLLOW UP COMPLIANCE: keeps appointments   PHYSICAL EXAM:  Temp 97.9 F (36.6 C)   Wt 195 lb 1.7 oz (88.5 kg)   BMI 28.81 kg/m  Well-developed well-nourished patient in NAD. HEENT reveals PERLA, EOMI, discs not visualized.  Oral cavity is clear. No oral mucosal lesions are identified. Neck is clear without evidence of cervical or supraclavicular adenopathy. Lungs are clear to A&P. Cardiac examination is essentially unremarkable with regular rate and rhythm without murmur rub or thrill. Abdomen is benign with no organomegaly or masses noted. Motor sensory and DTR levels are equal and symmetric in the upper and lower extremities. Cranial nerves II through XII are grossly intact. Proprioception is intact. No peripheral adenopathy or edema is identified. No motor or sensory levels are noted. Crude visual fields are within normal range.  RADIOLOGY RESULTS: No current films for review  PLAN: Present time he is under excellent biochemical control of his prostate cancer.  I am pleased with his overall progress.  I have asked to see him back in 1 year for follow-up with a PSA prior to his visit.  Patient is to call sooner with any  concerns.  I would like to take this opportunity to thank you for allowing me to participate in the care of your patient.Noreene Filbert, MD

## 2017-06-07 ENCOUNTER — Ambulatory Visit: Payer: Medicare HMO | Admitting: Internal Medicine

## 2017-06-15 ENCOUNTER — Other Ambulatory Visit: Payer: Self-pay | Admitting: Internal Medicine

## 2017-06-15 DIAGNOSIS — I639 Cerebral infarction, unspecified: Secondary | ICD-10-CM

## 2017-06-26 ENCOUNTER — Ambulatory Visit: Payer: Medicare HMO | Admitting: Internal Medicine

## 2017-06-28 ENCOUNTER — Ambulatory Visit (INDEPENDENT_AMBULATORY_CARE_PROVIDER_SITE_OTHER): Payer: Medicare HMO | Admitting: Internal Medicine

## 2017-06-28 ENCOUNTER — Encounter: Payer: Self-pay | Admitting: Internal Medicine

## 2017-06-28 VITALS — BP 145/90 | HR 67 | Ht 69.0 in | Wt 197.0 lb

## 2017-06-28 DIAGNOSIS — I10 Essential (primary) hypertension: Secondary | ICD-10-CM | POA: Diagnosis not present

## 2017-06-28 DIAGNOSIS — Z8673 Personal history of transient ischemic attack (TIA), and cerebral infarction without residual deficits: Secondary | ICD-10-CM

## 2017-06-28 DIAGNOSIS — E785 Hyperlipidemia, unspecified: Secondary | ICD-10-CM | POA: Diagnosis not present

## 2017-06-28 DIAGNOSIS — R609 Edema, unspecified: Secondary | ICD-10-CM | POA: Diagnosis not present

## 2017-06-28 DIAGNOSIS — I350 Nonrheumatic aortic (valve) stenosis: Secondary | ICD-10-CM

## 2017-06-28 MED ORDER — CLOPIDOGREL BISULFATE 75 MG PO TABS: 75.0000 mg | ORAL_TABLET | Freq: Every day | ORAL | 3 refills | Status: DC

## 2017-06-28 MED ORDER — LISINOPRIL 20 MG PO TABS: 20.0000 mg | ORAL_TABLET | Freq: Every day | ORAL | 3 refills | Status: DC

## 2017-06-28 NOTE — Progress Notes (Signed)
Date:  06/28/2017   Name:  [name]   DOB:  05-Dec-1935   MRN:  [id number]   Chief Complaint: Hypertension and Hyperlipidemia (Wants to know if he can reduce dose of atorvastatin to help with weakness in legs and knees. ) Hypertension  Associated symptoms include shortness of breath (with exertion). Pertinent negatives include no chest pain, headaches or palpitations. Past treatments include ACE inhibitors. The current treatment provides significant improvement. Hypertensive end-organ damage includes CVA.  Hyperlipidemia  This is a chronic problem. The problem is controlled. Associated symptoms include shortness of breath (with exertion). Pertinent negatives include no chest pain or myalgias. Current antihyperlipidemic treatment includes statins.  AS - moderate by ECHO with EF 55% in October.  Seen by Cardiology - no change in medication made.  Recommended LDL < 70. His wife wonders if the lipitor is causing his leg weakness/SOB.  He denies SOB unless he walks a long way and denies leg heaviness.  He plays golf 3 days a week and is able to complete the course in 3.5 hours.  CVA - no residual.  On Plavix and aspirin with no bleeding issues.  No labs done for the past year.   Lab Results  Component Value Date   CHOL 110 05/11/2016   HDL 43 05/11/2016   LDLCALC 56 05/11/2016   TRIG 57 05/11/2016   CHOLHDL 2.6 05/11/2016   Lab Results  Component Value Date   CREATININE 0.87 05/11/2016   BUN 17 05/11/2016   NA 137 05/11/2016   K 4.3 05/11/2016   CL 107 05/11/2016   CO2 27 05/11/2016    Review of Systems  Constitutional: Negative for chills, fatigue and fever.  HENT: Negative for trouble swallowing.   Eyes: Negative for visual disturbance.  Respiratory: Positive for shortness of breath (with exertion). Negative for cough, chest tightness and wheezing.   Cardiovascular: Negative for chest pain, palpitations and leg swelling.  Gastrointestinal: Negative for abdominal pain.    Musculoskeletal: Negative for arthralgias, gait problem and myalgias.  Skin: Negative for rash.  Neurological: Negative for dizziness, numbness and headaches.  Psychiatric/Behavioral: Negative for sleep disturbance.    Patient Active Problem List   Diagnosis Date Noted  . Moderate aortic valve stenosis 12/12/2016  . Aortic atherosclerosis ([place]) 12/12/2016  . Seasonal allergies 07/14/2016  . Hypertension 07/14/2016  . Adenocarcinoma of prostate ([place]) 11/23/2014  . Dyslipidemia 09/03/2014  . Edema extremities 09/03/2014  . History of CVA (cerebrovascular accident) without residual deficits 03/31/2012    Prior to Admission medications   Medication Sig Start Date End Date Taking? Authorizing Provider  aspirin 81 MG tablet Take 1 tablet by mouth daily.    [provider]  atorvastatin (LIPITOR) 80 MG tablet TAKE 1 TABLET DAILY 04/25/17   Glean Hess, MD  cholecalciferol (VITAMIN D) 400 UNITS TABS tablet Take 400 Units by mouth. Reported on 05/06/2015    [provider]  clopidogrel (PLAVIX) 75 MG tablet Take 1 tablet (75 mg total) by mouth daily. 11/06/16   Glean Hess, MD  clopidogrel (PLAVIX) 75 MG tablet TAKE 1 TABLET DAILY 06/15/17   Glean Hess, MD  lisinopril (PRINIVIL,ZESTRIL) 10 MG tablet TAKE 1 TABLET DAILY 11/10/16   Glean Hess, MD  vitamin B-12 (CYANOCOBALAMIN) 1000 MCG tablet Take 1,000 mcg by mouth daily.    [provider]    No Known Allergies  Past Surgical History:  Procedure Laterality Date  . INGUINAL HERNIA REPAIR Left  Social History   Tobacco Use  . Smoking status: Former Smoker    Types: Cigarettes    Last attempt to quit: 05/02/2011    Years since quitting: 6.1  . Smokeless tobacco: Never Used  Substance Use Topics  . Alcohol use: No    Alcohol/week: 0.0 oz  . Drug use: No     Medication list has been reviewed and updated.  PHQ 2/9 Scores 02/12/2017 07/14/2016 10/29/2014  PHQ - 2 Score 0 0 0     Physical Exam  Constitutional: He is oriented to person, place, and time. He appears well-developed. No distress.  HENT:  Head: Normocephalic and atraumatic.  Neck: Normal range of motion. Neck supple. No thyromegaly present.  Cardiovascular: Normal rate and regular rhythm.  Murmur heard.  Systolic murmur is present with a grade of 3/6. Pulses:      Dorsalis pedis pulses are 1+ on the right side, and 1+ on the left side.  Pulmonary/Chest: Effort normal and breath sounds normal. No respiratory distress. He has no decreased breath sounds. He has no wheezes. He has no rhonchi.  Abdominal: Soft. Bowel sounds are normal. He exhibits no distension.  Musculoskeletal: Normal range of motion. He exhibits edema (2+ edema mid tibia).  No muscle tenderness of thighs or upper arms.  Neurological: He is alert and oriented to person, place, and time.  Skin: Skin is warm and dry. No rash noted.  Psychiatric: He has a normal mood and affect. His behavior is normal. Thought content normal.  Nursing note and vitals reviewed.   BP (!) 145/90   Pulse 67   Ht 5\' 9"  (1.753 m)   Wt 197 lb (89.4 kg)   SpO2 96%   BMI 29.09 kg/m   Assessment and Plan: 1. Essential hypertension No controlled - increase lisinopril - Comprehensive metabolic panel - CBC with Differential/Platelet - TSH - lisinopril (PRINIVIL,ZESTRIL) 20 MG tablet; Take 1 tablet (20 mg total) by mouth daily.  Dispense: 90 tablet; Refill: 3  2. History of CVA (cerebrovascular accident) without residual deficits Continue aspirin and Plavix  3. Dyslipidemia Continue same dose of lipitor unless LDL is much less than 70 - Lipid panel  4. Moderate aortic valve stenosis Symptoms stable - discussed SOB, chest pain, syncope monitoring  5. Edema, unspecified type stable - lisinopril (PRINIVIL,ZESTRIL) 20 MG tablet; Take 1 tablet (20 mg total) by mouth daily.  Dispense: 90 tablet; Refill: 3   Meds ordered this encounter  Medications   . lisinopril (PRINIVIL,ZESTRIL) 20 MG tablet    Sig: Take 1 tablet (20 mg total) by mouth daily.    Dispense:  90 tablet    Refill:  3  . clopidogrel (PLAVIX) 75 MG tablet    Sig: Take 1 tablet (75 mg total) by mouth daily.    Dispense:  90 tablet    Refill:  3    Partially dictated using Editor, commissioning. Any errors are unintentional.  Halina Maidens, MD [facility]  06/28/2017

## 2017-06-28 NOTE — Patient Instructions (Signed)
Double Lisinopril to 20 mg per day.

## 2017-06-29 LAB — COMPREHENSIVE METABOLIC PANEL
ALK PHOS: 86 IU/L (ref 39–117)
ALT: 18 IU/L (ref 0–44)
AST: 20 IU/L (ref 0–40)
Albumin/Globulin Ratio: 1.4 (ref 1.2–2.2)
Albumin: 3.8 g/dL (ref 3.5–4.7)
BUN/Creatinine Ratio: 15 (ref 10–24)
BUN: 11 mg/dL (ref 8–27)
Bilirubin Total: 0.5 mg/dL (ref 0.0–1.2)
CHLORIDE: 107 mmol/L — AB (ref 96–106)
CO2: 21 mmol/L (ref 20–29)
Calcium: 9.2 mg/dL (ref 8.6–10.2)
Creatinine, Ser: 0.74 mg/dL — ABNORMAL LOW (ref 0.76–1.27)
GFR calc Af Amer: 101 mL/min/{1.73_m2} (ref >59)
GFR calc non Af Amer: 87 mL/min/{1.73_m2} (ref >59)
GLOBULIN, TOTAL: 2.7 g/dL (ref 1.5–4.5)
Glucose: 93 mg/dL (ref 65–99)
Potassium: 4.6 mmol/L (ref 3.5–5.2)
SODIUM: 144 mmol/L (ref 134–144)
Total Protein: 6.5 g/dL (ref 6.0–8.5)

## 2017-06-29 LAB — CBC WITH DIFFERENTIAL/PLATELET
Basophils Absolute: 0 10*3/uL (ref 0.0–0.2)
Basos: 1 %
EOS (ABSOLUTE): 0.1 10*3/uL (ref 0.0–0.4)
Eos: 2 %
Hematocrit: 43 % (ref 37.5–51.0)
Hemoglobin: 14.5 g/dL (ref 13.0–17.7)
IMMATURE GRANULOCYTES: 0 %
Immature Grans (Abs): 0 10*3/uL (ref 0.0–0.1)
Lymphocytes Absolute: 1 10*3/uL (ref 0.7–3.1)
Lymphs: 24 %
MCH: 32.7 pg (ref 26.6–33.0)
MCHC: 33.7 g/dL (ref 31.5–35.7)
MCV: 97 fL (ref 79–97)
MONOS ABS: 0.2 10*3/uL (ref 0.1–0.9)
Monocytes: 6 %
NEUTROS PCT: 67 %
Neutrophils Absolute: 2.7 10*3/uL (ref 1.4–7.0)
PLATELETS: 167 10*3/uL (ref 150–379)
RBC: 4.44 x10E6/uL (ref 4.14–5.80)
RDW: 13.7 % (ref 12.3–15.4)
WBC: 4 10*3/uL (ref 3.4–10.8)

## 2017-06-29 LAB — LIPID PANEL
Chol/HDL Ratio: 2.4 ratio (ref 0.0–5.0)
Cholesterol, Total: 115 mg/dL (ref 100–199)
HDL: 48 mg/dL (ref >39)
LDL Calculated: 55 mg/dL (ref 0–99)
Triglycerides: 61 mg/dL (ref 0–149)
VLDL Cholesterol Cal: 12 mg/dL (ref 5–40)

## 2017-06-29 LAB — TSH: TSH: 1.52 u[IU]/mL (ref 0.450–4.500)

## 2017-09-10 ENCOUNTER — Telehealth: Payer: Self-pay

## 2017-09-10 NOTE — Telephone Encounter (Signed)
Called pt to sched AWV w/ NHA. LVM requesting returned call.  

## 2017-11-15 ENCOUNTER — Ambulatory Visit: Payer: Medicare HMO | Admitting: Urology

## 2017-12-03 ENCOUNTER — Telehealth: Payer: Self-pay | Admitting: Internal Medicine

## 2017-12-03 NOTE — Telephone Encounter (Signed)
Called to schedule Medicare Annual Wellness Visit with Nurse Health Advisor. If patient returns call, please schedule AWV with NHA any date  °Thank you! °For any questions please contact: °Kathryn Brown [phone]  °Or Skype me at: [email or web address]  ° ° °

## 2017-12-27 ENCOUNTER — Ambulatory Visit (INDEPENDENT_AMBULATORY_CARE_PROVIDER_SITE_OTHER): Payer: Medicare HMO | Admitting: Internal Medicine

## 2017-12-27 ENCOUNTER — Encounter: Payer: Self-pay | Admitting: Internal Medicine

## 2017-12-27 VITALS — BP 117/78 | Resp 16 | Ht 69.0 in | Wt 185.0 lb

## 2017-12-27 DIAGNOSIS — Z8673 Personal history of transient ischemic attack (TIA), and cerebral infarction without residual deficits: Secondary | ICD-10-CM

## 2017-12-27 DIAGNOSIS — I10 Essential (primary) hypertension: Secondary | ICD-10-CM

## 2017-12-27 DIAGNOSIS — Z0000 Encounter for general adult medical examination without abnormal findings: Secondary | ICD-10-CM

## 2017-12-27 DIAGNOSIS — I350 Nonrheumatic aortic (valve) stenosis: Secondary | ICD-10-CM | POA: Diagnosis not present

## 2017-12-27 DIAGNOSIS — E785 Hyperlipidemia, unspecified: Secondary | ICD-10-CM | POA: Diagnosis not present

## 2017-12-27 DIAGNOSIS — Z8546 Personal history of malignant neoplasm of prostate: Secondary | ICD-10-CM | POA: Diagnosis not present

## 2017-12-27 DIAGNOSIS — I700 Atherosclerosis of aorta: Secondary | ICD-10-CM | POA: Diagnosis not present

## 2017-12-27 MED ORDER — CLOPIDOGREL BISULFATE 75 MG PO TABS: 75.0000 mg | ORAL_TABLET | Freq: Every day | ORAL | 3 refills | Status: DC

## 2017-12-27 NOTE — Progress Notes (Signed)
Date:  12/27/2017   Name:  [name]   DOB:  July 17, 1935   MRN:  [id number]   Chief Complaint: Annual Exam and Hypertension (refills ) [name] is a 82 y.o. male who presents today for his Complete Annual Exam. He feels well. He reports exercising playing golf. He reports he is sleeping fairly well. He declines all vaccinations.  Prostate cancer - followed by Urology, completed all therapy and if PSA remains low, he will be released to yearly follow up.  Hypertension  This is a chronic problem. The problem is controlled. Pertinent negatives include no chest pain, headaches, palpitations or shortness of breath. Past treatments include ACE inhibitors. The current treatment provides significant improvement. There are no compliance problems.   Hyperlipidemia  The problem is controlled. Pertinent negatives include no chest pain or shortness of breath. Current antihyperlipidemic treatment includes statins. The current treatment provides significant improvement of lipids. There are no compliance problems.    AS - pt denies progression of sx of shortness of breath.  He plays golf and has not had to limit his activities other than using the push mower. He is reluctant to continue to see Cardiology but I discussed the reasons for ongoing monitoring with pt and wife and he agrees to schedule follow up. ECHO 12/2016 - moderate AS with moderately thickened valve; moderate LVH and EF >55%  Review of Systems  Constitutional: Negative for appetite change, diaphoresis, fatigue, fever and unexpected weight change.  HENT: Positive for hearing loss. Negative for trouble swallowing and voice change.   Eyes: Negative for visual disturbance.  Respiratory: Negative for cough, chest tightness, shortness of breath and wheezing.   Cardiovascular: Negative for chest pain and palpitations.  Gastrointestinal: Negative for abdominal pain, constipation and diarrhea.  Endocrine: Negative for polyuria.    Genitourinary: Positive for frequency. Negative for difficulty urinating and dysuria.  Musculoskeletal: Negative for arthralgias, back pain and gait problem.  Skin: Negative for color change and rash.  Neurological: Negative for dizziness, light-headedness and headaches.  Psychiatric/Behavioral: Negative for dysphoric mood and sleep disturbance. The patient is not nervous/anxious.     Patient Active Problem List   Diagnosis Date Noted  . Moderate aortic valve stenosis 12/12/2016  . Aortic atherosclerosis ([place]) 12/12/2016  . Seasonal allergies 07/14/2016  . Hypertension 07/14/2016  . Adenocarcinoma of prostate ([place]) 11/23/2014  . Dyslipidemia 09/03/2014  . Edema extremities 09/03/2014  . History of CVA (cerebrovascular accident) without residual deficits 03/31/2012    No Known Allergies  Past Surgical History:  Procedure Laterality Date  . INGUINAL HERNIA REPAIR Left     Social History   Tobacco Use  . Smoking status: Former Smoker    Types: Cigarettes    Last attempt to quit: 05/02/2011    Years since quitting: 6.6  . Smokeless tobacco: Never Used  Substance Use Topics  . Alcohol use: No    Alcohol/week: 0.0 standard drinks  . Drug use: No     Medication list has been reviewed and updated.  Current Meds  Medication Sig  . aspirin 81 MG tablet Take 1 tablet by mouth daily. Every other day  . atorvastatin (LIPITOR) 80 MG tablet TAKE 1 TABLET DAILY  . clopidogrel (PLAVIX) 75 MG tablet Take 1 tablet (75 mg total) by mouth daily.  Marland Kitchen lisinopril (PRINIVIL,ZESTRIL) 20 MG tablet Take 1 tablet (20 mg total) by mouth daily.  . vitamin B-12 (CYANOCOBALAMIN) 1000 MCG tablet Take 1,000 mcg by mouth daily. Every other day  PHQ 2/9 Scores 12/27/2017 02/12/2017 07/14/2016 10/29/2014  PHQ - 2 Score 0 0 0 0  PHQ- 9 Score 0 - - -    Physical Exam  Constitutional: He is oriented to person, place, and time. He appears well-developed and well-nourished.  HENT:  Head:  Normocephalic.  Right Ear: Tympanic membrane, external ear and ear canal normal.  Left Ear: Tympanic membrane, external ear and ear canal normal.  Nose: Nose normal.  Mouth/Throat: Uvula is midline and oropharynx is clear and moist.  Eyes: Pupils are equal, round, and reactive to light. Conjunctivae and EOM are normal.  Neck: Normal range of motion. Neck supple. Carotid bruit is not present. No thyromegaly present.  Cardiovascular: Normal rate, regular rhythm and intact distal pulses.  No extrasystoles are present.  Murmur heard.  Systolic murmur is present with a grade of 3/6. Pulmonary/Chest: Effort normal and breath sounds normal. He has no wheezes. Right breast exhibits no mass. Left breast exhibits no mass.  Abdominal: Soft. Normal appearance and bowel sounds are normal. There is no hepatosplenomegaly. There is no tenderness.  Musculoskeletal: Normal range of motion. He exhibits edema (trace ankle edema).  Lymphadenopathy:    He has no cervical adenopathy.  Neurological: He is alert and oriented to person, place, and time. He has normal reflexes.  Skin: Skin is warm, dry and intact.  Psychiatric: He has a normal mood and affect. His speech is normal and behavior is normal. Judgment and thought content normal.  Nursing note and vitals reviewed.   BP 117/78   Resp 16   Ht 5\' 9"  (1.753 m)   Wt 185 lb (83.9 kg)   BMI 27.32 kg/m   Assessment and Plan: 1. Annual physical exam Continue healthy diet and regular physical activity  2. Aortic atherosclerosis ([facility])  Continue lipitor and aspirin  3. Essential hypertension controlled - CBC with Differential/Platelet - Comprehensive metabolic panel  4. History of CVA (cerebrovascular accident) without residual deficits Resolved with no recurrent sx - clopidogrel (PLAVIX) 75 MG tablet; Take 1 tablet (75 mg total) by mouth daily.  Dispense: 90 tablet; Refill: 3  5. Dyslipidemia - Lipid panel  6. Personal history of prostate  cancer Followed by Oncology and Urology  7. Moderate aortic valve stenosis Pt to follow up annually with Cardiology  Meds ordered this encounter  Medications  . clopidogrel (PLAVIX) 75 MG tablet    Sig: Take 1 tablet (75 mg total) by mouth daily.    Dispense:  90 tablet    Refill:  3    Partially dictated using Editor, commissioning. Any errors are unintentional.  Halina Maidens, MD [facility]  12/27/2017   There are no diagnoses linked to this encounter.

## 2017-12-28 LAB — COMPREHENSIVE METABOLIC PANEL
A/G RATIO: 1.6 (ref 1.2–2.2)
ALK PHOS: 79 IU/L (ref 39–117)
ALT: 17 IU/L (ref 0–44)
AST: 23 IU/L (ref 0–40)
Albumin: 3.8 g/dL (ref 3.5–4.7)
BUN / CREAT RATIO: 17 (ref 10–24)
BUN: 13 mg/dL (ref 8–27)
Bilirubin Total: 0.4 mg/dL (ref 0.0–1.2)
CO2: 20 mmol/L (ref 20–29)
Calcium: 9.1 mg/dL (ref 8.6–10.2)
Chloride: 106 mmol/L (ref 96–106)
Creatinine, Ser: 0.78 mg/dL (ref 0.76–1.27)
GFR calc Af Amer: 99 mL/min/{1.73_m2} (ref >59)
GFR, EST NON AFRICAN AMERICAN: 85 mL/min/{1.73_m2} (ref >59)
GLOBULIN, TOTAL: 2.4 g/dL (ref 1.5–4.5)
Glucose: 91 mg/dL (ref 65–99)
POTASSIUM: 4.7 mmol/L (ref 3.5–5.2)
SODIUM: 141 mmol/L (ref 134–144)
Total Protein: 6.2 g/dL (ref 6.0–8.5)

## 2017-12-28 LAB — CBC WITH DIFFERENTIAL/PLATELET
BASOS: 1 %
Basophils Absolute: 0 10*3/uL (ref 0.0–0.2)
EOS (ABSOLUTE): 0.1 10*3/uL (ref 0.0–0.4)
EOS: 2 %
HEMATOCRIT: 41.5 % (ref 37.5–51.0)
Hemoglobin: 14.3 g/dL (ref 13.0–17.7)
Immature Grans (Abs): 0 10*3/uL (ref 0.0–0.1)
Immature Granulocytes: 0 %
LYMPHS ABS: 1 10*3/uL (ref 0.7–3.1)
Lymphs: 21 %
MCH: 33 pg (ref 26.6–33.0)
MCHC: 34.5 g/dL (ref 31.5–35.7)
MCV: 96 fL (ref 79–97)
Monocytes Absolute: 0.3 10*3/uL (ref 0.1–0.9)
Monocytes: 7 %
NEUTROS ABS: 3.2 10*3/uL (ref 1.4–7.0)
Neutrophils: 69 %
Platelets: 187 10*3/uL (ref 150–450)
RBC: 4.33 x10E6/uL (ref 4.14–5.80)
RDW: 12.5 % (ref 12.3–15.4)
WBC: 4.7 10*3/uL (ref 3.4–10.8)

## 2017-12-28 LAB — LIPID PANEL
CHOL/HDL RATIO: 2.5 ratio (ref 0.0–5.0)
CHOLESTEROL TOTAL: 113 mg/dL (ref 100–199)
HDL: 46 mg/dL (ref >39)
LDL Calculated: 55 mg/dL (ref 0–99)
TRIGLYCERIDES: 58 mg/dL (ref 0–149)
VLDL Cholesterol Cal: 12 mg/dL (ref 5–40)

## 2018-01-02 ENCOUNTER — Ambulatory Visit (INDEPENDENT_AMBULATORY_CARE_PROVIDER_SITE_OTHER): Payer: Medicare HMO

## 2018-01-02 VITALS — BP 120/78 | HR 75 | Temp 99.2°F | Resp 14 | Ht 69.0 in | Wt 186.4 lb

## 2018-01-02 DIAGNOSIS — Z0000 Encounter for general adult medical examination without abnormal findings: Secondary | ICD-10-CM

## 2018-01-02 NOTE — Progress Notes (Signed)
Subjective:   [name] is a 82 y.o. male who presents for Medicare Annual/Subsequent preventive examination.  Review of Systems:  N/A Cardiac Risk Factors include: advanced age (>56men, >70 women);dyslipidemia;hypertension;male gender;sedentary lifestyle     Objective:    Vitals: BP 120/78 (BP Location: Right Arm, Patient Position: Sitting, Cuff Size: Normal)   Pulse 75   Temp 99.2 F (37.3 C) (Oral)   Resp 14   Ht 5\' 9"  (1.753 m)   Wt 186 lb 6.4 oz (84.6 kg)   SpO2 95%   BMI 27.53 kg/m   Body mass index is 27.53 kg/m.  Advanced Directives 01/02/2018 05/31/2017 02/12/2017 05/11/2016 10/29/2014  Does Patient Have a Medical Advance Directive? Yes No Yes No Yes  Type of Paramedic of War;Living will - [place];Living will - [place];Living will  Does patient want to make changes to medical advance directive? - - - - No - Patient declined  Copy of Media in Chart? Yes - No - copy requested - No - copy requested  Would patient like information on creating a medical advance directive? - - - No - Patient declined -    Tobacco Social History   Tobacco Use  Smoking Status Former Smoker  . Packs/day: 0.50  . Years: 50.00  . Pack years: 25.00  . Types: Cigarettes  . Last attempt to quit: 05/02/2011  . Years since quitting: 6.6  Smokeless Tobacco Never Used  Tobacco Comment   smoking cessation materials not required     Counseling given: No Comment: smoking cessation materials not required  Clinical Intake:  Pre-visit preparation completed: Yes  Pain : No/denies pain   BMI - recorded: 27.53 Nutritional Status: BMI 25 -29 Overweight Nutritional Risks: None Diabetes: No  How often do you need to have someone help you when you read instructions, pamphlets, or other written materials from your doctor or pharmacy?: 1 - Never  Interpreter Needed?: No  Information entered by ::  AEversole, LPN  Past Medical History:  Diagnosis Date  . Edema extremities   . Elevated PSA   . HLD (hyperlipidemia)   . Hypertension   . Kidney stones   . Prostate cancer ([place])   . Stroke [facility])    Past Surgical History:  Procedure Laterality Date  . INGUINAL HERNIA REPAIR Left    Family History  Problem Relation Age of Onset  . Parkinson's disease Mother   . Narcolepsy Mother   . Stroke Brother   . Prostate cancer Brother   . Congestive Heart Failure Brother   . Alzheimer's disease Daughter   . Kidney disease Neg Hx   . Kidney cancer Neg Hx   . Bladder Cancer Neg Hx    Social History   Socioeconomic History  . Marital status: Married    Spouse name: Not on file  . Number of children: 2  . Years of education: Not on file  . Highest education level: GED or equivalent  Occupational History  . Occupation: Retired  Scientific laboratory technician  . Financial resource strain: Not hard at all  . Food insecurity:    Worry: Never true    Inability: Never true  . Transportation needs:    Medical: No    Non-medical: No  Tobacco Use  . Smoking status: Former Smoker    Packs/day: 0.50    Years: 50.00    Pack years: 25.00    Types: Cigarettes    Last attempt  to quit: 05/02/2011    Years since quitting: 6.6  . Smokeless tobacco: Never Used  . Tobacco comment: smoking cessation materials not required  Substance and Sexual Activity  . Alcohol use: No    Alcohol/week: 0.0 standard drinks  . Drug use: No  . Sexual activity: Never  Lifestyle  . Physical activity:    Days per week: 0 days    Minutes per session: 0 min  . Stress: Not at all  Relationships  . Social connections:    Talks on phone: Patient refused    Gets together: Patient refused    Attends religious service: Patient refused    Active member of club or organization: Patient refused    Attends meetings of clubs or organizations: Patient refused    Relationship status: Married  Other Topics Concern  . Not on file    Social History Narrative  . Not on file    Outpatient Encounter Medications as of 01/02/2018  Medication Sig  . aspirin 81 MG tablet Take 1 tablet by mouth daily. Every other day  . atorvastatin (LIPITOR) 80 MG tablet TAKE 1 TABLET DAILY  . clopidogrel (PLAVIX) 75 MG tablet Take 1 tablet (75 mg total) by mouth daily.  Marland Kitchen lisinopril (PRINIVIL,ZESTRIL) 20 MG tablet Take 1 tablet (20 mg total) by mouth daily.  . vitamin B-12 (CYANOCOBALAMIN) 1000 MCG tablet Take 1,000 mcg by mouth daily. Every other day   No facility-administered encounter medications on file as of 01/02/2018.     Activities of Daily Living In your present state of health, do you have any difficulty performing the following activities: 01/02/2018 12/27/2017  Hearing? N N  Comment denies hearing aids -  Vision? N N  Comment wears eyeglasses -  Difficulty concentrating or making decisions? N N  Walking or climbing stairs? N N  Dressing or bathing? N N  Doing errands, shopping? N N  Preparing Food and eating ? N -  Comment partial upper and lower dentures -  Using the Toilet? N -  In the past six months, have you accidently leaked urine? N -  Do you have problems with loss of bowel control? N -  Managing your Medications? N -  Managing your Finances? N -  Housekeeping or managing your Housekeeping? N -  Some recent data might be hidden    Patient Care Team: Glean Hess, MD as PCP - General (Internal Medicine) Noreene Filbert, MD as Referring Physician (Radiation Oncology)   Assessment:   This is a routine wellness examination for [name].  Exercise Activities and Dietary recommendations Current Exercise Habits: The patient does not participate in regular exercise at present, Exercise limited by: None identified  Goals    . DIET - INCREASE WATER INTAKE     Recommend to drink at least 6-8 8oz glasses of water per day.    . Prevent Falls     Recommend to remove items from your home that may cause you to trip  or slip       Fall Risk Fall Risk  01/02/2018 12/27/2017 02/12/2017 07/14/2016 12/28/2015  Falls in the past year? Yes No No No Yes  Comment - - - - Emmi Telephone Survey: data to providers prior to load  Number falls in past yr: 1 - - - 1  Comment - - - - Emmi Telephone Survey Actual Response = 1  Injury with Fall? Yes - - - Yes  Comment broken ribs - - - -  Risk for  fall due to : Impaired vision - - - -  Risk for fall due to: Comment wears eyeglasses - - - -  Follow up Falls evaluation completed;Education provided;Falls prevention discussed - - - -   FALL RISK PREVENTION PERTAINING TO HOME: Is your home free of loose throw rugs in walkways, pet beds, electrical cords, etc? Yes Is there adequate lighting in your home to reduce risk of falls?  Yes Are there stairs in or around your home WITH handrails? Yes  ASSISTIVE DEVICES UTILIZED TO PREVENT FALLS: Use of a cane, walker or w/c? No Grab bars in the bathroom? Yes  Shower chair or a place to sit while bathing? No An elevated toilet seat or a handicapped toilet? No  Timed Get Up and Go Performed: Yes. Pt ambulated 10 feet within 12 sec. Gait slow, steady and without the use of an assistive device. No intervention required at this time. Fall risk prevention has been discussed.  Community Resource Referral:  Pt declined my offer to send [employer] Referral to Care Guide for a shower chair or an elevated toilet seat.  Depression Screen PHQ 2/9 Scores 01/02/2018 12/27/2017 02/12/2017 07/14/2016  PHQ - 2 Score 0 0 0 0  PHQ- 9 Score 0 0 - -    Cognitive Function     6CIT Screen 01/02/2018  What Year? 0 points  What month? 0 points  What time? 0 points  Count back from 20 0 points  Months in reverse 0 points  Repeat phrase 2 points  Total Score 2     There is no immunization history on file for this patient.  Qualifies for Shingles Vaccine? Yes. Due for Shingrix. Education has been provided regarding the importance of  this vaccine. Pt has been advised to call insurance company to determine out of pocket expense. Advised may also receive vaccine at local pharmacy or Health Dept. Verbalized acceptance and understanding.  Due for Flu and Pneumoccocal vaccines. Declined my offer to administer today. Education has been provided regarding the importance of this vaccine but still declined. Advised may receive this vaccine at local pharmacy or Health Dept. Aware to provide a copy of the vaccination record if obtained from local pharmacy or Health Dept. Verbalized acceptance and understanding.  Due for Tdap vaccine. Education has been provided regarding the importance of this vaccine. Advised may receive this vaccine at local pharmacy or Health Dept. Aware to provide a copy of the vaccination record if obtained from local pharmacy or Health Dept. Verbalized acceptance and understanding.  Screening Tests Health Maintenance  Topic Date Due  . PNA vac Low Risk Adult (1 of 2 - PCV13) 01/29/2018 (Originally 05/27/2002)  . TETANUS/TDAP  01/03/2019 (Originally 05/27/1954)  . INFLUENZA VACCINE  05/08/2021 (Originally 11/29/2017)   Cancer Screenings: Lung: Low Dose CT Chest recommended if Age 63-80 years, 30 pack-year currently smoking OR have quit w/in 15years. Patient does not qualify. Colorectal: No longer required  Additional Screenings: Hepatitis C Screening: Does not qualify     Plan:  I have personally reviewed and addressed the Medicare Annual Wellness questionnaire and have noted the following in the patient's chart:  A. Medical and social history B. Use of alcohol, tobacco or illicit drugs  C. Current medications and supplements D. Functional ability and status E.  Nutritional status F.  Physical activity G. Advance directives H. List of other physicians I.  Hospitalizations, surgeries, and ER visits in previous 12 months J.  Vitals K. Screenings such as hearing and  vision if needed, cognitive and  depression L. Referrals and appointments  In addition, I have reviewed and discussed with patient certain preventive protocols, quality metrics, and best practice recommendations. A written personalized care plan for preventive services as well as general preventive health recommendations were provided to patient.  Signed,  Aleatha Borer, LPN Nurse Health Advisor  MD Recommendations: Due for Shingrix. Education has been provided regarding the importance of this vaccine. Pt has been advised to call insurance company to determine out of pocket expense. Advised may also receive vaccine at local pharmacy or Health Dept. Verbalized acceptance and understanding.  Due for Flu and Pneumoccocal vaccines. Declined my offer to administer today. Education has been provided regarding the importance of this vaccine but still declined. Advised may receive this vaccine at local pharmacy or Health Dept. Aware to provide a copy of the vaccination record if obtained from local pharmacy or Health Dept. Verbalized acceptance and understanding.  Due for Tdap vaccine. Education has been provided regarding the importance of this vaccine. Advised may receive this vaccine at local pharmacy or Health Dept. Aware to provide a copy of the vaccination record if obtained from local pharmacy or Health Dept. Verbalized acceptance and understanding.

## 2018-01-02 NOTE — Patient Instructions (Addendum)
[name] , Thank you for taking time to come for your Medicare Wellness Visit. I appreciate your ongoing commitment to your health goals. Please review the following plan we discussed and let me know if I can assist you in the future.   Screening recommendations/referrals: Colorectal Screening: No longer required  Vision and Dental Exams: Recommended annual ophthalmology exams for early detection of glaucoma and other disorders of the eye Recommended annual dental exams for proper oral hygiene  Vaccinations: Influenza vaccine: Declined Pneumococcal vaccine: Declined Tdap vaccine: Declined. Please call your insurance company to determine your out of pocket expense. You may also receive this vaccine at your local pharmacy or Health Dept. Shingles vaccine: Please call your insurance company to determine your out of pocket expense for the Shingrix vaccine. You may also receive this vaccine at your local pharmacy or Health Dept.    Advanced directives: We have received a copy of your POA (Power of [name]) and/or Living Will. These documents can be located in your chart.  Goals: Recommend to drink at least 6-8 8oz glasses of water per day.  Next appointment: Please schedule your Annual Wellness Visit with your Nurse Health Advisor in one year.  Preventive Care 80 Years and Older, Male Preventive care refers to lifestyle choices and visits with your health care provider that can promote health and wellness. What does preventive care include?  A yearly physical exam. This is also called an annual well check.  Dental exams once or twice a year.  Routine eye exams. Ask your health care provider how often you should have your eyes checked.  Personal lifestyle choices, including:  Daily care of your teeth and gums.  Regular physical activity.  Eating a healthy diet.  Avoiding tobacco and drug use.  Limiting alcohol use.  Practicing safe sex.  Taking low doses of aspirin every  day.  Taking vitamin and mineral supplements as recommended by your health care provider. What happens during an annual well check? The services and screenings done by your health care provider during your annual well check will depend on your age, overall health, lifestyle risk factors, and family history of disease. Counseling  Your health care provider may ask you questions about your:  Alcohol use.  Tobacco use.  Drug use.  Emotional well-being.  Home and relationship well-being.  Sexual activity.  Eating habits.  History of falls.  Memory and ability to understand (cognition).  Work and work Statistician. Screening  You may have the following tests or measurements:  Height, weight, and BMI.  Blood pressure.  Lipid and cholesterol levels. These may be checked every 5 years, or more frequently if you are over 80 years old.  Skin check.  Lung cancer screening. You may have this screening every year starting at age 80 if you have a 30-pack-year history of smoking and currently smoke or have quit within the past 15 years.  Fecal occult blood test (FOBT) of the stool. You may have this test every year starting at age 80.  Flexible sigmoidoscopy or colonoscopy. You may have a sigmoidoscopy every 5 years or a colonoscopy every 10 years starting at age 80.  Prostate cancer screening. Recommendations will vary depending on your family history and other risks.  Hepatitis C blood test.  Hepatitis B blood test.  Sexually transmitted disease (STD) testing.  Diabetes screening. This is done by checking your blood sugar (glucose) after you have not eaten for a while (fasting). You may have this done every 1-3  years.  Abdominal aortic aneurysm (AAA) screening. You may need this if you are a current or former smoker.  Osteoporosis. You may be screened starting at age 80 if you are at high risk. Talk with your health care provider about your test results, treatment options,  and if necessary, the need for more tests. Vaccines  Your health care provider may recommend certain vaccines, such as:  Influenza vaccine. This is recommended every year.  Tetanus, diphtheria, and acellular pertussis (Tdap, Td) vaccine. You may need a Td booster every 10 years.  Zoster vaccine. You may need this after age 80.  Pneumococcal 13-valent conjugate (PCV13) vaccine. One dose is recommended after age 80.  Pneumococcal polysaccharide (PPSV23) vaccine. One dose is recommended after age 80. Talk to your health care provider about which screenings and vaccines you need and how often you need them. This information is not intended to replace advice given to you by your health care provider. Make sure you discuss any questions you have with your health care provider. Document Released: 05/14/2015 Document Revised: 01/05/2016 Document Reviewed: 02/16/2015 Elsevier Interactive Patient Education  2017 [employer] Prevention in the Home Falls can cause injuries. They can happen to people of all ages. There are many things you can do to make your home safe and to help prevent falls. What can I do on the outside of my home?  Regularly fix the edges of walkways and driveways and fix any cracks.  Remove anything that might make you trip as you walk through a door, such as a raised step or threshold.  Trim any bushes or trees on the path to your home.  Use bright outdoor lighting.  Clear any walking paths of anything that might make someone trip, such as rocks or tools.  Regularly check to see if handrails are loose or broken. Make sure that both sides of any steps have handrails.  Any raised decks and porches should have guardrails on the edges.  Have any leaves, snow, or ice cleared regularly.  Use sand or salt on walking paths during winter.  Clean up any spills in your garage right away. This includes oil or grease spills. What can I do in the bathroom?  Use night  lights.  Install grab bars by the toilet and in the tub and shower. Do not use towel bars as grab bars.  Use non-skid mats or decals in the tub or shower.  If you need to sit down in the shower, use a plastic, non-slip stool.  Keep the floor dry. Clean up any water that spills on the floor as soon as it happens.  Remove soap buildup in the tub or shower regularly.  Attach bath mats securely with double-sided non-slip rug tape.  Do not have throw rugs and other things on the floor that can make you trip. What can I do in the bedroom?  Use night lights.  Make sure that you have a light by your bed that is easy to reach.  Do not use any sheets or blankets that are too big for your bed. They should not hang down onto the floor.  Have a firm chair that has side arms. You can use this for support while you get dressed.  Do not have throw rugs and other things on the floor that can make you trip. What can I do in the kitchen?  Clean up any spills right away.  Avoid walking on wet floors.  Keep items that  you use a lot in easy-to-reach places.  If you need to reach something above you, use a strong step stool that has a grab bar.  Keep electrical cords out of the way.  Do not use floor polish or wax that makes floors slippery. If you must use wax, use non-skid floor wax.  Do not have throw rugs and other things on the floor that can make you trip. What can I do with my stairs?  Do not leave any items on the stairs.  Make sure that there are handrails on both sides of the stairs and use them. Fix handrails that are broken or loose. Make sure that handrails are as long as the stairways.  Check any carpeting to make sure that it is firmly attached to the stairs. Fix any carpet that is loose or worn.  Avoid having throw rugs at the top or bottom of the stairs. If you do have throw rugs, attach them to the floor with carpet tape.  Make sure that you have a light switch at the  top of the stairs and the bottom of the stairs. If you do not have them, ask someone to add them for you. What else can I do to help prevent falls?  Wear shoes that:  Do not have high heels.  Have rubber bottoms.  Are comfortable and fit you well.  Are closed at the toe. Do not wear sandals.  If you use a stepladder:  Make sure that it is fully opened. Do not climb a closed stepladder.  Make sure that both sides of the stepladder are locked into place.  Ask someone to hold it for you, if possible.  Clearly mark and make sure that you can see:  Any grab bars or handrails.  First and last steps.  Where the edge of each step is.  Use tools that help you move around (mobility aids) if they are needed. These include:  Canes.  Walkers.  Scooters.  Crutches.  Turn on the lights when you go into a dark area. Replace any light bulbs as soon as they burn out.  Set up your furniture so you have a clear path. Avoid moving your furniture around.  If any of your floors are uneven, fix them.  If there are any pets around you, be aware of where they are.  Review your medicines with your doctor. Some medicines can make you feel dizzy. This can increase your chance of falling. Ask your doctor what other things that you can do to help prevent falls. This information is not intended to replace advice given to you by your health care provider. Make sure you discuss any questions you have with your health care provider. Document Released: 02/11/2009 Document Revised: 09/23/2015 Document Reviewed: 05/22/2014 Elsevier Interactive Patient Education  2017 [employer].

## 2018-02-18 ENCOUNTER — Ambulatory Visit: Payer: Medicare HMO

## 2018-03-21 ENCOUNTER — Other Ambulatory Visit: Payer: Self-pay | Admitting: Internal Medicine

## 2018-03-21 DIAGNOSIS — R609 Edema, unspecified: Secondary | ICD-10-CM

## 2018-03-21 DIAGNOSIS — E785 Hyperlipidemia, unspecified: Secondary | ICD-10-CM

## 2018-03-21 DIAGNOSIS — I10 Essential (primary) hypertension: Secondary | ICD-10-CM

## 2018-05-23 ENCOUNTER — Inpatient Hospital Stay: Payer: Medicare HMO | Attending: Radiation Oncology

## 2018-05-23 DIAGNOSIS — C61 Malignant neoplasm of prostate: Secondary | ICD-10-CM | POA: Insufficient documentation

## 2018-05-23 DIAGNOSIS — R197 Diarrhea, unspecified: Secondary | ICD-10-CM | POA: Diagnosis not present

## 2018-05-23 DIAGNOSIS — Z923 Personal history of irradiation: Secondary | ICD-10-CM | POA: Insufficient documentation

## 2018-05-23 LAB — PSA: PROSTATIC SPECIFIC ANTIGEN: 0.01 ng/mL (ref 0.00–4.00)

## 2018-05-30 ENCOUNTER — Encounter: Payer: Self-pay | Admitting: Radiation Oncology

## 2018-05-30 ENCOUNTER — Other Ambulatory Visit: Payer: Self-pay

## 2018-05-30 ENCOUNTER — Ambulatory Visit
Admission: RE | Admit: 2018-05-30 | Discharge: 2018-05-30 | Disposition: A | Discharge status: Home / Self Care | Payer: Medicare HMO | Source: Ambulatory Visit | Attending: Radiation Oncology | Admitting: Radiation Oncology

## 2018-05-30 VITALS — BP 150/93 | HR 59 | Temp 97.1°F | Resp 18 | Wt 187.7 lb

## 2018-05-30 DIAGNOSIS — C61 Malignant neoplasm of prostate: Secondary | ICD-10-CM | POA: Diagnosis not present

## 2018-05-30 DIAGNOSIS — Z923 Personal history of irradiation: Secondary | ICD-10-CM | POA: Insufficient documentation

## 2018-05-30 DIAGNOSIS — C775 Secondary and unspecified malignant neoplasm of intrapelvic lymph nodes: Secondary | ICD-10-CM | POA: Diagnosis not present

## 2018-05-30 NOTE — Progress Notes (Signed)
Radiation Oncology Follow up Note  Name: [name]   Date:   05/30/2018 MRN:  [id number] DOB: [birth year]    This 83 y.o. male presents to the clinic today for 4-1/2 year follow-up status post IM RT radiation therapy to his prostate for Gleason 7 (4+3) adenocarcinoma the prostate.  REFERRING PROVIDER: Glean Hess, MD  HPI: patient is a 83 year old male now out 4-1/2 years having completed IM RT radiation therapy to his prostate and pelvic nodes for Gleason 7 (4+3) adenocarcinoma the prostate. He is seen today in routine follow-up and is doing well. He specifically denies any increased lower urinary tract symptoms diarrhea. His most recent PSA is less than 0.01.Marland Kitchen  COMPLICATIONS OF TREATMENT: none  FOLLOW UP COMPLIANCE: keeps appointments   PHYSICAL EXAM:  BP (!) 150/93 (BP Location: Left Arm, Patient Position: Sitting)   Pulse (!) 59   Temp (!) 97.1 F (36.2 C) (Tympanic)   Resp 18   Wt 187 lb 11.6 oz (85.2 kg)   BMI 27.72 kg/m  Well-developed well-nourished patient in NAD. HEENT reveals PERLA, EOMI, discs not visualized.  Oral cavity is clear. No oral mucosal lesions are identified. Neck is clear without evidence of cervical or supraclavicular adenopathy. Lungs are clear to A&P. Cardiac examination is essentially unremarkable with regular rate and rhythm without murmur rub or thrill. Abdomen is benign with no organomegaly or masses noted. Motor sensory and DTR levels are equal and symmetric in the upper and lower extremities. Cranial nerves II through XII are grossly intact. Proprioception is intact. No peripheral adenopathy or edema is identified. No motor or sensory levels are noted. Crude visual fields are within normal range.  RADIOLOGY RESULTS: no current films for review  PLAN: present time patient is doing well under excellent biochemical control of his prostate cancer now out close to 5 years at this time I'm going to discontinue follow-up care. I would be happy to  reevaluate patient at any time should further consultation be indicated. Patient's private M.D. will be running his yearly PSAs. Patient is to call with any concerns.  I would like to take this opportunity to thank you for allowing me to participate in the care of your patient.Noreene Filbert, MD

## 2018-12-11 ENCOUNTER — Other Ambulatory Visit: Payer: Self-pay | Admitting: Internal Medicine

## 2018-12-11 DIAGNOSIS — Z8673 Personal history of transient ischemic attack (TIA), and cerebral infarction without residual deficits: Secondary | ICD-10-CM

## 2018-12-31 ENCOUNTER — Encounter: Payer: Medicare HMO | Admitting: Internal Medicine

## 2019-01-02 ENCOUNTER — Other Ambulatory Visit: Payer: Self-pay

## 2019-01-02 ENCOUNTER — Ambulatory Visit (INDEPENDENT_AMBULATORY_CARE_PROVIDER_SITE_OTHER): Payer: Medicare HMO | Admitting: Internal Medicine

## 2019-01-02 ENCOUNTER — Encounter: Payer: Self-pay | Admitting: Internal Medicine

## 2019-01-02 VITALS — BP 128/70 | HR 56 | Ht 69.0 in | Wt 177.0 lb

## 2019-01-02 DIAGNOSIS — Z0000 Encounter for general adult medical examination without abnormal findings: Secondary | ICD-10-CM | POA: Diagnosis not present

## 2019-01-02 DIAGNOSIS — I10 Essential (primary) hypertension: Secondary | ICD-10-CM

## 2019-01-02 DIAGNOSIS — E785 Hyperlipidemia, unspecified: Secondary | ICD-10-CM | POA: Diagnosis not present

## 2019-01-02 DIAGNOSIS — I700 Atherosclerosis of aorta: Secondary | ICD-10-CM

## 2019-01-02 DIAGNOSIS — I350 Nonrheumatic aortic (valve) stenosis: Secondary | ICD-10-CM

## 2019-01-02 NOTE — Progress Notes (Signed)
Date:  01/02/2019   Name:  [name]   DOB:  09/20/1935   MRN:  [id number]   Chief Complaint: Annual Exam (Declined flu shot- does not get them.) Jemaine Zisk is a 83 y.o. male who presents today for his Complete Annual Exam. He feels well. He reports exercising walking and playing golf. He reports he is sleeping well. He and his wife are stressed out caring for their 109 y/o daughter with advanced Alzheimers disease.  Due for pneumonia vaccines - he declines all vaccines  Hypertension This is a chronic problem. The problem is controlled. Pertinent negatives include no chest pain, headaches, palpitations or shortness of breath. Past treatments include ACE inhibitors. The current treatment provides significant improvement.  Hyperlipidemia The problem is controlled. Pertinent negatives include no chest pain, myalgias or shortness of breath. Current antihyperlipidemic treatment includes statins. The current treatment provides significant improvement of lipids.  Moderate AS - noted on ECHO in 2018.  He denies chest pain, sob, decreased exercise tolerance, PND.  He is on lisinopril and plavix for previous CVA without residual.  Lab Results  Component Value Date   CREATININE 0.78 12/27/2017   BUN 13 12/27/2017   NA 141 12/27/2017   K 4.7 12/27/2017   CL 106 12/27/2017   CO2 20 12/27/2017   Lab Results  Component Value Date   CHOL 113 12/27/2017   HDL 46 12/27/2017   LDLCALC 55 12/27/2017   TRIG 58 12/27/2017   CHOLHDL 2.5 12/27/2017     Review of Systems  Constitutional: Negative for appetite change, chills, diaphoresis, fatigue and unexpected weight change.  HENT: Negative for hearing loss, tinnitus, trouble swallowing and voice change.   Eyes: Negative for visual disturbance.  Respiratory: Negative for choking, shortness of breath and wheezing.   Cardiovascular: Positive for leg swelling (mild chronic edema from VV). Negative for chest pain and palpitations.   Gastrointestinal: Negative for abdominal pain, blood in stool, constipation and diarrhea.  Genitourinary: Negative for difficulty urinating, dysuria and frequency.  Musculoskeletal: Negative for arthralgias, back pain and myalgias.  Skin: Negative for color change and rash.  Allergic/Immunologic: Negative for environmental allergies.  Neurological: Negative for dizziness, syncope and headaches.  Hematological: Negative for adenopathy.  Psychiatric/Behavioral: Negative for decreased concentration, dysphoric mood and sleep disturbance. The patient is not nervous/anxious.     Patient Active Problem List   Diagnosis Date Noted  . Moderate aortic valve stenosis 12/12/2016  . Aortic atherosclerosis ([place]) 12/12/2016  . Seasonal allergies 07/14/2016  . Hypertension 07/14/2016  . Personal history of prostate cancer 11/23/2014  . Dyslipidemia 09/03/2014  . Edema extremities 09/03/2014  . History of CVA (cerebrovascular accident) without residual deficits 03/31/2012    No Known Allergies  Past Surgical History:  Procedure Laterality Date  . INGUINAL HERNIA REPAIR Left     Social History   Tobacco Use  . Smoking status: Former Smoker    Packs/day: 0.50    Years: 50.00    Pack years: 25.00    Types: Cigarettes    Quit date: 05/02/2011    Years since quitting: 7.6  . Smokeless tobacco: Never Used  . Tobacco comment: smoking cessation materials not required  Substance Use Topics  . Alcohol use: No    Alcohol/week: 0.0 standard drinks  . Drug use: No     Medication list has been reviewed and updated.  Current Meds  Medication Sig  . aspirin 81 MG tablet Take 1 tablet by mouth daily. Every other day  .  atorvastatin (LIPITOR) 80 MG tablet TAKE 1 TABLET DAILY  . clopidogrel (PLAVIX) 75 MG tablet TAKE 1 TABLET DAILY  . lisinopril (PRINIVIL,ZESTRIL) 20 MG tablet TAKE 1 TABLET DAILY  . vitamin B-12 (CYANOCOBALAMIN) 1000 MCG tablet Take 1,000 mcg by mouth daily. Every other day     PHQ 2/9 Scores 01/02/2019 01/02/2018 12/27/2017 02/12/2017  PHQ - 2 Score 0 0 0 0  PHQ- 9 Score - 0 0 -   6CIT Screen 01/02/2019 01/02/2018  What Year? 0 points 0 points  What month? 0 points 0 points  What time? 0 points 0 points  Count back from 20 0 points 0 points  Months in reverse 0 points 0 points  Repeat phrase 0 points 2 points  Total Score 0 2      BP Readings from Last 3 Encounters:  01/02/19 128/70  05/30/18 (!) 150/93  01/02/18 120/78    Physical Exam Vitals signs and nursing note reviewed.  Constitutional:      Appearance: Normal appearance. He is well-developed.  HENT:     Head: Normocephalic.     Right Ear: Tympanic membrane, ear canal and external ear normal.     Left Ear: Tympanic membrane, ear canal and external ear normal.     Nose: Nose normal.     Mouth/Throat:     Pharynx: Uvula midline.  Eyes:     Conjunctiva/sclera: Conjunctivae normal.     Pupils: Pupils are equal, round, and reactive to light.  Neck:     Musculoskeletal: Normal range of motion and neck supple.     Thyroid: No thyromegaly.     Vascular: No carotid bruit.  Cardiovascular:     Rate and Rhythm: Normal rate and regular rhythm.  No extrasystoles are present.    Pulses:          Radial pulses are 2+ on the right side and 2+ on the left side.       Dorsalis pedis pulses are 1+ on the right side and 1+ on the left side.       Posterior tibial pulses are 1+ on the right side and 1+ on the left side.     Heart sounds: Murmur present. Systolic murmur present with a grade of 4/6. No friction rub. No gallop.   Pulmonary:     Effort: Pulmonary effort is normal.     Breath sounds: Normal breath sounds. No wheezing.  Chest:     Breasts:        Right: No mass.        Left: No mass.  Abdominal:     General: Bowel sounds are normal.     Palpations: Abdomen is soft.     Tenderness: There is no abdominal tenderness.  Musculoskeletal: Normal range of motion.     Right lower leg: 1+ Edema  present.     Left lower leg: 1+ Edema present.  Lymphadenopathy:     Cervical: No cervical adenopathy.  Skin:    General: Skin is warm and dry.  Neurological:     Mental Status: He is alert and oriented to person, place, and time.     Deep Tendon Reflexes: Reflexes are normal and symmetric.  Psychiatric:        Speech: Speech normal.        Behavior: Behavior normal.        Thought Content: Thought content normal.        Judgment: Judgment normal.     Wt Readings from  Last 3 Encounters:  01/02/19 177 lb (80.3 kg)  05/30/18 187 lb 11.6 oz (85.2 kg)  01/02/18 186 lb 6.4 oz (84.6 kg)    BP 128/70   Pulse (!) 56   Ht 5\' 9"  (1.753 m)   Wt 177 lb (80.3 kg)   SpO2 96%   BMI 26.14 kg/m   Assessment and Plan: 1. Annual physical exam Normal exam Continue regular exercise, healthy diet He declines all vaccines  2. Essential hypertension Clinically stable exam with well controlled BP.   Tolerating medications, lisinopril 20 mg daily, without side effects at this time. Pt to continue current regimen and low sodium diet; benefits of regular exercise as able discussed. - CBC with Differential/Platelet - Comprehensive metabolic panel  3. Dyslipidemia Tolerating statin medication without side effects at this time LDL is at goal of < 70 on current dose Continue same therapy without change at this time - atorvastatin 80 mg. - Lipid panel  4. Aortic atherosclerosis (H[facility]) Continue Plavix and atorvastatin  5. Moderate aortic valve stenosis Clinically stable without chest pain, PND, orthopnea, SOB on exertion. Mild edema unchanged   Partially dictated using Editor, commissioning. Any errors are unintentional.  Halina Maidens, MD [facility]  01/02/2019

## 2019-01-03 LAB — CBC WITH DIFFERENTIAL/PLATELET
Basophils Absolute: 0 10*3/uL (ref 0.0–0.2)
Basos: 1 %
EOS (ABSOLUTE): 0.1 10*3/uL (ref 0.0–0.4)
Eos: 2 %
Hematocrit: 41.2 % (ref 37.5–51.0)
Hemoglobin: 13.9 g/dL (ref 13.0–17.7)
Immature Grans (Abs): 0 10*3/uL (ref 0.0–0.1)
Immature Granulocytes: 0 %
Lymphocytes Absolute: 0.9 10*3/uL (ref 0.7–3.1)
Lymphs: 24 %
MCH: 32.5 pg (ref 26.6–33.0)
MCHC: 33.7 g/dL (ref 31.5–35.7)
MCV: 96 fL (ref 79–97)
Monocytes Absolute: 0.3 10*3/uL (ref 0.1–0.9)
Monocytes: 8 %
Neutrophils Absolute: 2.4 10*3/uL (ref 1.4–7.0)
Neutrophils: 65 %
Platelets: 154 10*3/uL (ref 150–450)
RBC: 4.28 x10E6/uL (ref 4.14–5.80)
RDW: 12.8 % (ref 11.6–15.4)
WBC: 3.6 10*3/uL (ref 3.4–10.8)

## 2019-01-03 LAB — LIPID PANEL
Chol/HDL Ratio: 2.2 ratio (ref 0.0–5.0)
Cholesterol, Total: 112 mg/dL (ref 100–199)
HDL: 50 mg/dL (ref >39)
LDL Chol Calc (NIH): 49 mg/dL (ref 0–99)
Triglycerides: 57 mg/dL (ref 0–149)
VLDL Cholesterol Cal: 13 mg/dL (ref 5–40)

## 2019-01-03 LAB — COMPREHENSIVE METABOLIC PANEL
ALT: 14 IU/L (ref 0–44)
AST: 20 IU/L (ref 0–40)
Albumin/Globulin Ratio: 2 (ref 1.2–2.2)
Albumin: 3.9 g/dL (ref 3.6–4.6)
Alkaline Phosphatase: 71 IU/L (ref 39–117)
BUN/Creatinine Ratio: 19 (ref 10–24)
BUN: 16 mg/dL (ref 8–27)
Bilirubin Total: 0.4 mg/dL (ref 0.0–1.2)
CO2: 21 mmol/L (ref 20–29)
Calcium: 9 mg/dL (ref 8.6–10.2)
Chloride: 107 mmol/L — ABNORMAL HIGH (ref 96–106)
Creatinine, Ser: 0.85 mg/dL (ref 0.76–1.27)
GFR calc Af Amer: 94 mL/min/{1.73_m2} (ref >59)
GFR calc non Af Amer: 82 mL/min/{1.73_m2} (ref >59)
Globulin, Total: 2 g/dL (ref 1.5–4.5)
Glucose: 86 mg/dL (ref 65–99)
Potassium: 4.8 mmol/L (ref 3.5–5.2)
Sodium: 140 mmol/L (ref 134–144)
Total Protein: 5.9 g/dL — ABNORMAL LOW (ref 6.0–8.5)

## 2019-03-07 ENCOUNTER — Telehealth: Payer: Self-pay | Admitting: Internal Medicine

## 2019-03-07 NOTE — Telephone Encounter (Signed)
Called to schedule Medicare Annual Wellness Visit with Nurse Health Advisor, Kasey Uthus at [facility]. If patient returns call, please schedule AWV with [facility] ~ Any date on [facility] schedule (Mon or Wed)  °Questions regarding scheduling, please call  [phone] or MS Teams > [email or web address]  ° °Kathryn Brown  °Care Guide • Embedded Care Coordination °[place]   Care Management °[email or web address]   [phone]  ° ° ° °

## 2019-03-19 ENCOUNTER — Ambulatory Visit (INDEPENDENT_AMBULATORY_CARE_PROVIDER_SITE_OTHER): Payer: Medicare HMO

## 2019-03-19 ENCOUNTER — Other Ambulatory Visit: Payer: Self-pay

## 2019-03-19 VITALS — BP 159/79 | HR 55 | Temp 98.0°F | Ht 69.0 in | Wt 178.0 lb

## 2019-03-19 DIAGNOSIS — Z0000 Encounter for general adult medical examination without abnormal findings: Secondary | ICD-10-CM

## 2019-03-19 DIAGNOSIS — I10 Essential (primary) hypertension: Secondary | ICD-10-CM

## 2019-03-19 DIAGNOSIS — E785 Hyperlipidemia, unspecified: Secondary | ICD-10-CM

## 2019-03-19 DIAGNOSIS — R609 Edema, unspecified: Secondary | ICD-10-CM

## 2019-03-19 MED ORDER — LISINOPRIL 20 MG PO TABS: 20.0000 mg | ORAL_TABLET | Freq: Every day | ORAL | 3 refills | Status: DC

## 2019-03-19 MED ORDER — ATORVASTATIN CALCIUM 80 MG PO TABS: 80.0000 mg | ORAL_TABLET | Freq: Every day | ORAL | 3 refills | Status: DC

## 2019-03-19 NOTE — Progress Notes (Signed)
Subjective:   [name] is a 83 y.o. male who presents for Medicare Annual/Subsequent preventive examination.  Virtual Visit via Telephone Note  I connected with [name] on 03/19/19 at  3:20 PM EST by telephone and verified that I am speaking with the correct person using two identifiers.  Medicare Annual Wellness visit completed telephonically due to Covid-19 pandemic.   Location: Patient: home Provider: office   I discussed the limitations, risks, security and privacy concerns of performing an evaluation and management service by telephone and the availability of in person appointments. The patient expressed understanding and agreed to proceed.  Some vital signs may be absent or patient reported.   [name], LPN    Review of Systems:         Objective:    Vitals: There were no vitals taken for this visit.  There is no height or weight on file to calculate BMI.  Advanced Directives 05/30/2018 01/02/2018 05/31/2017 02/12/2017 05/11/2016 10/29/2014  Does Patient Have a Medical Advance Directive? No Yes No Yes No Yes  Type of Advance Directive - [place];Living will - [place];Living will - [place];Living will  Does patient want to make changes to medical advance directive? - - - - - No - Patient declined  Copy of [place] in Chart? - Yes - No - copy requested - No - copy requested  Would patient like information on creating a medical advance directive? No - Patient declined - - - No - Patient declined -    Tobacco Social History   Tobacco Use  Smoking Status Former Smoker  . Packs/day: 0.50  . Years: 50.00  . Pack years: 25.00  . Types: Cigarettes  . Quit date: 05/02/2011  . Years since quitting: 7.8  Smokeless Tobacco Never Used  Tobacco Comment   smoking cessation materials not required     Counseling given: Not Answered Comment: smoking cessation materials not required    Clinical Intake:                       Past Medical History:  Diagnosis Date  . Edema extremities   . Elevated PSA   . HLD (hyperlipidemia)   . Hypertension   . Kidney stones   . Prostate cancer ([place])   . Stroke [facility])    Past Surgical History:  Procedure Laterality Date  . INGUINAL HERNIA REPAIR Left    Family History  Problem Relation Age of Onset  . Parkinson's disease Mother   . Narcolepsy Mother   . Stroke Brother   . Prostate cancer Brother   . Congestive Heart Failure Brother   . Alzheimer's disease Daughter   . Kidney disease Neg Hx   . Kidney cancer Neg Hx   . Bladder Cancer Neg Hx    Social History   Socioeconomic History  . Marital status: Married    Spouse name: Not on file  . Number of children: 2  . Years of education: Not on file  . Highest education level: GED or equivalent  Occupational History  . Occupation: Retired  Scientific laboratory technician  . Financial resource strain: Not hard at all  . Food insecurity    Worry: Never true    Inability: Never true  . Transportation needs    Medical: No    Non-medical: No  Tobacco Use  . Smoking status: Former Smoker    Packs/day: 0.50  Years: 50.00    Pack years: 25.00    Types: Cigarettes    Quit date: 05/02/2011    Years since quitting: 7.8  . Smokeless tobacco: Never Used  . Tobacco comment: smoking cessation materials not required  Substance and Sexual Activity  . Alcohol use: No    Alcohol/week: 0.0 standard drinks  . Drug use: No  . Sexual activity: Never  Lifestyle  . Physical activity    Days per week: 0 days    Minutes per session: 0 min  . Stress: Not at all  Relationships  . Social Herbalist on phone: Patient refused    Gets together: Patient refused    Attends religious service: Patient refused    Active member of club or organization: Patient refused    Attends meetings of clubs or organizations: Patient refused    Relationship status: Married  Other Topics  Concern  . Not on file  Social History Narrative  . Not on file    Outpatient Encounter Medications as of 03/19/2019  Medication Sig  . aspirin 81 MG tablet Take 1 tablet by mouth daily. Every other day  . atorvastatin (LIPITOR) 80 MG tablet TAKE 1 TABLET DAILY  . clopidogrel (PLAVIX) 75 MG tablet TAKE 1 TABLET DAILY  . lisinopril (PRINIVIL,ZESTRIL) 20 MG tablet TAKE 1 TABLET DAILY  . vitamin B-12 (CYANOCOBALAMIN) 1000 MCG tablet Take 1,000 mcg by mouth daily. Every other day   No facility-administered encounter medications on file as of 03/19/2019.     Activities of Daily Living No flowsheet data found.  Patient Care Team: Glean Hess, MD as PCP - General (Internal Medicine) Noreene Filbert, MD as Referring Physician (Radiation Oncology) Corey Skains, MD as Consulting Physician (Cardiology)   Assessment:   This is a routine wellness examination for [name].  Exercise Activities and Dietary recommendations    Goals    . DIET - INCREASE WATER INTAKE     Recommend to drink at least 6-8 8oz glasses of water per day.    . Prevent Falls     Recommend to remove items from your home that may cause you to trip or slip       Fall Risk Fall Risk  01/02/2019 01/02/2018 12/27/2017 02/12/2017 07/14/2016  Falls in the past year? 0 Yes No No No  Comment - - - - -  Number falls in past yr: 0 1 - - -  Comment - - - - -  Injury with Fall? 0 Yes - - -  Comment - broken ribs - - -  Risk for fall due to : - Impaired vision - - -  Risk for fall due to: Comment - wears eyeglasses - - -  Follow up Falls evaluation completed Falls evaluation completed;Education provided;Falls prevention discussed - - -   FALL RISK PREVENTION PERTAINING TO THE HOME:  Any stairs in or around the home? No  If so, do they handrails? No   Home free of loose throw rugs in walkways, pet beds, electrical cords, etc? Yes  Adequate lighting in your home to reduce risk of falls? Yes   ASSISTIVE DEVICES  UTILIZED TO PREVENT FALLS:  Life alert? No  Use of a cane, walker or w/c? No  Grab bars in the bathroom? Yes  Shower chair or bench in shower? No  Elevated toilet seat or a handicapped toilet? No   DME ORDERS:  DME order needed?  No   TIMED UP AND GO:  Was the test performed? No . Telephonic visit.   Education: Fall risk prevention has been discussed.  Intervention(s) required? No    Depression Screen PHQ 2/9 Scores 01/02/2019 01/02/2018 12/27/2017 02/12/2017  PHQ - 2 Score 0 0 0 0  PHQ- 9 Score - 0 0 -    Cognitive Function 6CIT done at CPE     6CIT Screen 01/02/2019 01/02/2018  What Year? 0 points 0 points  What month? 0 points 0 points  What time? 0 points 0 points  Count back from 20 0 points 0 points  Months in reverse 0 points 0 points  Repeat phrase 0 points 2 points  Total Score 0 2     There is no immunization history on file for this patient.  Qualifies for Shingles Vaccine? Yes . Due for Shingrix. Education has been provided regarding the importance of this vaccine. Pt has been advised to call insurance company to determine out of pocket expense. Advised may also receive vaccine at local pharmacy or Health Dept. Verbalized acceptance and understanding.  Tdap: Although this vaccine is not a covered service during a Wellness Exam, does the patient still wish to receive this vaccine today?  No .  Education has been provided regarding the importance of this vaccine. Advised may receive this vaccine at local pharmacy or Health Dept. Aware to provide a copy of the vaccination record if obtained from local pharmacy or Health Dept. Verbalized acceptance and understanding.  Flu Vaccine: Due for Flu vaccine. Does the patient want to receive this vaccine today?  No . Education has been provided regarding the importance of this vaccine but still declined. Advised may receive this vaccine at local pharmacy or Health Dept. Aware to provide a copy of the vaccination record if  obtained from local pharmacy or Health Dept. Verbalized acceptance and understanding.  Pneumococcal Vaccine: Due for Pneumococcal vaccine. Does the patient want to receive this vaccine today?  No . Education has been provided regarding the importance of this vaccine but still declined. Advised may receive this vaccine at local pharmacy or Health Dept. Aware to provide a copy of the vaccination record if obtained from local pharmacy or Health Dept. Verbalized acceptance and understanding.   Screening Tests Health Maintenance  Topic Date Due  . TETANUS/TDAP  05/27/1954  . INFLUENZA VACCINE  05/08/2021 (Originally 11/30/2018)  . PNA vac Low Risk Adult  Discontinued   Cancer Screenings:  Colorectal Screening:  No longer required.   Lung Cancer Screening: (Low Dose CT Chest recommended if Age 68-80 years, 30 pack-year currently smoking OR have quit w/in 15years.) does not qualify.   Additional Screening:  Hepatitis C Screening: no longer required  Vision Screening: Recommended annual ophthalmology exams for early detection of glaucoma and other disorders of the eye. Is the patient up to date with their annual eye exam?  No  Who is the provider or what is the name of the office in which the pt attends annual eye exams? Not established If pt is not established with a provider, would they like to be referred to a provider to establish care? No . Ophthalmology referral has been placed. Pt aware the office will call re: appt.  Dental Screening: Recommended annual dental exams for proper oral hygiene  Community Resource Referral:  CRR required this visit?  No       Plan:    I have personally reviewed and addressed the Medicare Annual Wellness questionnaire and have noted the following in the patient's chart:  A. Medical and social history B. Use of alcohol, tobacco or illicit drugs  C. Current medications and supplements D. Functional ability and status E.  Nutritional status F.   Physical activity G. Advance directives H. List of other physicians I.  Hospitalizations, surgeries, and ER visits in previous 12 months J.  [place] such as hearing and vision if needed, cognitive and depression L. Referrals and appointments   In addition, I have reviewed and discussed with patient certain preventive protocols, quality metrics, and best practice recommendations. A written personalized care plan for preventive services as well as general preventive health recommendations were provided to patient.   Signed,  [name], LPN Nurse Health Advisor   Nurse Notes: pt states he has wound on back side of lower right leg that happened approx 2 weeks ago from an accident but states it has improved and will notify office if sxs worsen or progress. Pt declined offer for appt.

## 2019-03-19 NOTE — Patient Instructions (Signed)
[name] , Thank you for taking time to come for your Medicare Wellness Visit. I appreciate your ongoing commitment to your health goals. Please review the following plan we discussed and let me know if I can assist you in the future.   Screening recommendations/referrals: Colonoscopy: no longer required Recommended yearly ophthalmology/optometry visit for glaucoma screening and checkup Recommended yearly dental visit for hygiene and checkup  Vaccinations: Influenza vaccine: postponed Pneumococcal vaccine: postponed Tdap vaccine: postponed Shingles vaccine: Shingrix discussed. Please contact your pharmacy for coverage information.   Conditions/risks identified: Keep up the great work!  Next appointment: Please follow up in one year for your Medicare Annual Wellness visit.    Preventive Care 59 Years and Older, Male Preventive care refers to lifestyle choices and visits with your health care provider that can promote health and wellness. What does preventive care include?  A yearly physical exam. This is also called an annual well check.  Dental exams once or twice a year.  Routine eye exams. Ask your health care provider how often you should have your eyes checked.  Personal lifestyle choices, including:  Daily care of your teeth and gums.  Regular physical activity.  Eating a healthy diet.  Avoiding tobacco and drug use.  Limiting alcohol use.  Practicing safe sex.  Taking low doses of aspirin every day.  Taking vitamin and mineral supplements as recommended by your health care provider. What happens during an annual well check? The services and screenings done by your health care provider during your annual well check will depend on your age, overall health, lifestyle risk factors, and family history of disease. Counseling  Your health care provider may ask you questions about your:  Alcohol use.  Tobacco use.  Drug use.  Emotional well-being.  Home and  relationship well-being.  Sexual activity.  Eating habits.  History of falls.  Memory and ability to understand (cognition).  Work and work Statistician. Screening  You may have the following tests or measurements:  Height, weight, and BMI.  Blood pressure.  Lipid and cholesterol levels. These may be checked every 5 years, or more frequently if you are over 32 years old.  Skin check.  Lung cancer screening. You may have this screening every year starting at age 71 if you have a 30-pack-year history of smoking and currently smoke or have quit within the past 15 years.  Fecal occult blood test (FOBT) of the stool. You may have this test every year starting at age 74.  Flexible sigmoidoscopy or colonoscopy. You may have a sigmoidoscopy every 5 years or a colonoscopy every 10 years starting at age 47.  Prostate cancer screening. Recommendations will vary depending on your family history and other risks.  Hepatitis C blood test.  Hepatitis B blood test.  Sexually transmitted disease (STD) testing.  Diabetes screening. This is done by checking your blood sugar (glucose) after you have not eaten for a while (fasting). You may have this done every 1-3 years.  Abdominal aortic aneurysm (AAA) screening. You may need this if you are a current or former smoker.  Osteoporosis. You may be screened starting at age 80 if you are at high risk. Talk with your health care provider about your test results, treatment options, and if necessary, the need for more tests. Vaccines  Your health care provider may recommend certain vaccines, such as:  Influenza vaccine. This is recommended every year.  Tetanus, diphtheria, and acellular pertussis (Tdap, Td) vaccine. You may need a Td  booster every 10 years.  Zoster vaccine. You may need this after age 53.  Pneumococcal 13-valent conjugate (PCV13) vaccine. One dose is recommended after age 27.  Pneumococcal polysaccharide (PPSV23) vaccine. One  dose is recommended after age 21. Talk to your health care provider about which screenings and vaccines you need and how often you need them. This information is not intended to replace advice given to you by your health care provider. Make sure you discuss any questions you have with your health care provider. Document Released: 05/14/2015 Document Revised: 01/05/2016 Document Reviewed: 02/16/2015 Elsevier Interactive Patient Education  2017 [place] Prevention in the Home Falls can cause injuries. They can happen to people of all ages. There are many things you can do to make your home safe and to help prevent falls. What can I do on the outside of my home?  Regularly fix the edges of walkways and driveways and fix any cracks.  Remove anything that might make you trip as you walk through a door, such as a raised step or threshold.  Trim any bushes or trees on the path to your home.  Use bright outdoor lighting.  Clear any walking paths of anything that might make someone trip, such as rocks or tools.  Regularly check to see if handrails are loose or broken. Make sure that both sides of any steps have handrails.  Any raised decks and porches should have guardrails on the edges.  Have any leaves, snow, or ice cleared regularly.  Use sand or salt on walking paths during winter.  Clean up any spills in your garage right away. This includes oil or grease spills. What can I do in the bathroom?  Use night lights.  Install grab bars by the toilet and in the tub and shower. Do not use towel bars as grab bars.  Use non-skid mats or decals in the tub or shower.  If you need to sit down in the shower, use a plastic, non-slip stool.  Keep the floor dry. Clean up any water that spills on the floor as soon as it happens.  Remove soap buildup in the tub or shower regularly.  Attach bath mats securely with double-sided non-slip rug tape.  Do not have throw rugs and other  things on the floor that can make you trip. What can I do in the bedroom?  Use night lights.  Make sure that you have a light by your bed that is easy to reach.  Do not use any sheets or blankets that are too big for your bed. They should not hang down onto the floor.  Have a firm chair that has side arms. You can use this for support while you get dressed.  Do not have throw rugs and other things on the floor that can make you trip. What can I do in the kitchen?  Clean up any spills right away.  Avoid walking on wet floors.  Keep items that you use a lot in easy-to-reach places.  If you need to reach something above you, use a strong step stool that has a grab bar.  Keep electrical cords out of the way.  Do not use floor polish or wax that makes floors slippery. If you must use wax, use non-skid floor wax.  Do not have throw rugs and other things on the floor that can make you trip. What can I do with my stairs?  Do not leave any items on the stairs.  Make sure that there are handrails on both sides of the stairs and use them. Fix handrails that are broken or loose. Make sure that handrails are as long as the stairways.  Check any carpeting to make sure that it is firmly attached to the stairs. Fix any carpet that is loose or worn.  Avoid having throw rugs at the top or bottom of the stairs. If you do have throw rugs, attach them to the floor with carpet tape.  Make sure that you have a light switch at the top of the stairs and the bottom of the stairs. If you do not have them, ask someone to add them for you. What else can I do to help prevent falls?  Wear shoes that:  Do not have high heels.  Have rubber bottoms.  Are comfortable and fit you well.  Are closed at the toe. Do not wear sandals.  If you use a stepladder:  Make sure that it is fully opened. Do not climb a closed stepladder.  Make sure that both sides of the stepladder are locked into place.  Ask  someone to hold it for you, if possible.  Clearly mark and make sure that you can see:  Any grab bars or handrails.  First and last steps.  Where the edge of each step is.  Use tools that help you move around (mobility aids) if they are needed. These include:  Canes.  Walkers.  Scooters.  Crutches.  Turn on the lights when you go into a dark area. Replace any light bulbs as soon as they burn out.  Set up your furniture so you have a clear path. Avoid moving your furniture around.  If any of your floors are uneven, fix them.  If there are any pets around you, be aware of where they are.  Review your medicines with your doctor. Some medicines can make you feel dizzy. This can increase your chance of falling. Ask your doctor what other things that you can do to help prevent falls. This information is not intended to replace advice given to you by your health care provider. Make sure you discuss any questions you have with your health care provider. Document Released: 02/11/2009 Document Revised: 09/23/2015 Document Reviewed: 05/22/2014 Elsevier Interactive Patient Education  2017 [employer].

## 2019-07-03 ENCOUNTER — Ambulatory Visit (INDEPENDENT_AMBULATORY_CARE_PROVIDER_SITE_OTHER): Payer: Medicare HMO | Admitting: Internal Medicine

## 2019-07-03 ENCOUNTER — Other Ambulatory Visit: Payer: Self-pay

## 2019-07-03 ENCOUNTER — Encounter: Payer: Self-pay | Admitting: Internal Medicine

## 2019-07-03 VITALS — BP 124/80 | HR 67 | Temp 96.8°F | Ht 69.0 in | Wt 179.0 lb

## 2019-07-03 DIAGNOSIS — I10 Essential (primary) hypertension: Secondary | ICD-10-CM | POA: Diagnosis not present

## 2019-07-03 DIAGNOSIS — I700 Atherosclerosis of aorta: Secondary | ICD-10-CM | POA: Diagnosis not present

## 2019-07-03 DIAGNOSIS — Z8673 Personal history of transient ischemic attack (TIA), and cerebral infarction without residual deficits: Secondary | ICD-10-CM | POA: Diagnosis not present

## 2019-07-03 NOTE — Progress Notes (Signed)
p   Date:  07/03/2019   Name:  [name]   DOB:  01/04/1936   MRN:  [id number]   Chief Complaint: Hypertension  Hypertension This is a chronic problem. The problem is controlled. Pertinent negatives include no chest pain, headaches, palpitations or shortness of breath. Past treatments include ACE inhibitors. The current treatment provides significant improvement. Hypertensive end-organ damage includes CVA.  Hyperlipidemia This is a chronic problem. The problem is controlled. Pertinent negatives include no chest pain or shortness of breath. Current antihyperlipidemic treatment includes statins. The current treatment provides significant improvement of lipids.  CVA - no neurologic residual.  Pt denies issues such as bleeding on aspirin and Plavix.  He feels well with good energy level and no weakness, numbness or speech impairments.  His cholesterol is well controlled.  Immunization History  Administered Date(s) Administered  . PFIZER SARS-COV-2 Vaccination 05/26/2019, 06/16/2019     Lab Results  Component Value Date   CREATININE 0.85 01/02/2019   BUN 16 01/02/2019   NA 140 01/02/2019   K 4.8 01/02/2019   CL 107 (H) 01/02/2019   CO2 21 01/02/2019   Lab Results  Component Value Date   CHOL 112 01/02/2019   HDL 50 01/02/2019   LDLCALC 49 01/02/2019   TRIG 57 01/02/2019   CHOLHDL 2.2 01/02/2019   Lab Results  Component Value Date   TSH 1.520 06/28/2017   No results found for: HGBA1C   Review of Systems  Constitutional: Negative for chills and fatigue.  Respiratory: Negative for chest tightness and shortness of breath.   Cardiovascular: Negative for chest pain, palpitations and leg swelling.  Neurological: Negative for dizziness, speech difficulty, weakness and headaches.  Psychiatric/Behavioral: Negative for dysphoric mood and sleep disturbance. The patient is not nervous/anxious.     Patient Active Problem List   Diagnosis Date Noted  . Moderate aortic valve  stenosis 12/12/2016  . Aortic atherosclerosis ([place]) 12/12/2016  . Seasonal allergies 07/14/2016  . Hypertension 07/14/2016  . Personal history of prostate cancer 11/23/2014  . Dyslipidemia 09/03/2014  . Edema extremities 09/03/2014  . History of CVA (cerebrovascular accident) without residual deficits 03/31/2012    No Known Allergies  Past Surgical History:  Procedure Laterality Date  . INGUINAL HERNIA REPAIR Left     Social History   Tobacco Use  . Smoking status: Former Smoker    Packs/day: 0.50    Years: 50.00    Pack years: 25.00    Types: Cigarettes    Quit date: 05/02/2011    Years since quitting: 8.1  . Smokeless tobacco: Never Used  . Tobacco comment: smoking cessation materials not required  Substance Use Topics  . Alcohol use: No    Alcohol/week: 0.0 standard drinks  . Drug use: No     Medication list has been reviewed and updated.  Current Meds  Medication Sig  . aspirin 81 MG tablet Take 1 tablet by mouth daily. Every other day  . atorvastatin (LIPITOR) 80 MG tablet Take 1 tablet (80 mg total) by mouth daily.  . clopidogrel (PLAVIX) 75 MG tablet TAKE 1 TABLET DAILY  . lisinopril (ZESTRIL) 20 MG tablet Take 1 tablet (20 mg total) by mouth daily.  . vitamin B-12 (CYANOCOBALAMIN) 1000 MCG tablet Take 1,000 mcg by mouth daily. Every other day    PHQ 2/9 Scores 07/03/2019 03/19/2019 01/02/2019 01/02/2018  PHQ - 2 Score 0 0 0 0  PHQ- 9 Score 0 - - 0    BP Readings from Last 3  Encounters:  07/03/19 124/80  03/19/19 (!) 159/79  01/02/19 128/70    Physical Exam Vitals and nursing note reviewed.  Constitutional:      General: He is not in acute distress.    Appearance: Normal appearance. He is well-developed.  HENT:     Head: Normocephalic and atraumatic.  Cardiovascular:     Rate and Rhythm: Normal rate and regular rhythm.     Pulses: Normal pulses.     Heart sounds: No murmur.  Pulmonary:     Effort: Pulmonary effort is normal. No respiratory  distress.     Breath sounds: No wheezing or rhonchi.  Musculoskeletal:     Cervical back: Normal range of motion.     Right lower leg: Edema present.     Left lower leg: Edema present.  Skin:    General: Skin is warm and dry.     Capillary Refill: Capillary refill takes less than 2 seconds.     Findings: No rash.  Neurological:     General: No focal deficit present.     Mental Status: He is alert and oriented to person, place, and time.  Psychiatric:        Behavior: Behavior normal.        Thought Content: Thought content normal.     Wt Readings from Last 3 Encounters:  07/03/19 179 lb (81.2 kg)  03/19/19 178 lb (80.7 kg)  01/02/19 177 lb (80.3 kg)    BP 124/80   Pulse 67   Temp (!) 96.8 F (36 C) (Temporal)   Ht 5\' 9"  (1.753 m)   Wt 179 lb (81.2 kg)   SpO2 95%   BMI 26.43 kg/m   Assessment and Plan: 1. Essential hypertension Clinically stable exam with well controlled BP on lisinopril. Tolerating medications without side effects at this time. Pt to continue current regimen and low sodium diet; benefits of regular exercise as able discussed.  2. Aortic atherosclerosis ([place]) Tolerating statin medication without side effects at this time LDL is at goal of < 70 on current dose Continue same therapy without change at this time.  3. History of CVA (cerebrovascular accident) without residual deficits Continue Plavix and aspirin   Partially dictated using Editor, commissioning. Any errors are unintentional.  Halina Maidens, MD [facility]  07/03/2019

## 2019-11-23 ENCOUNTER — Other Ambulatory Visit: Payer: Self-pay | Admitting: Internal Medicine

## 2019-11-23 DIAGNOSIS — Z8673 Personal history of transient ischemic attack (TIA), and cerebral infarction without residual deficits: Secondary | ICD-10-CM

## 2019-11-23 NOTE — Telephone Encounter (Signed)
Requested Prescriptions  Pending Prescriptions Disp Refills   clopidogrel (PLAVIX) 75 MG tablet [Pharmacy Med Name: CLOPIDOGREL  TAB 75MG ] 90 tablet 0    Sig: TAKE 1 TABLET DAILY     Hematology: Antiplatelets - clopidogrel Failed - 11/23/2019  8:13 AM      Failed - Evaluate AST, ALT within 2 months of therapy initiation.      Failed - HCT in normal range and within 180 days    Hematocrit  Date Value Ref Range Status  01/02/2019 41.2 37.5 - 51.0 % Final         Failed - HGB in normal range and within 180 days    Hemoglobin  Date Value Ref Range Status  01/02/2019 13.9 13.0 - 17.7 g/dL Final         Failed - PLT in normal range and within 180 days    Platelets  Date Value Ref Range Status  01/02/2019 154 150 - 450 x10E3/uL Final         Passed - ALT in normal range and within 360 days    ALT  Date Value Ref Range Status  01/02/2019 14 0 - 44 IU/L Final   SGPT (ALT)  Date Value Ref Range Status  04/25/2012 20 12 - 78 U/L Final         Passed - AST in normal range and within 360 days    AST  Date Value Ref Range Status  01/02/2019 20 0 - 40 IU/L Final   SGOT(AST)  Date Value Ref Range Status  04/25/2012 22 15 - 37 Unit/L Final         Passed - Valid encounter within last 6 months    Recent Outpatient Visits          4 months ago Essential hypertension   [facility] Glean Hess, MD   10 months ago Annual physical exam   [facility] Glean Hess, MD   1 year ago Annual physical exam   [facility] Glean Hess, MD   2 years ago Essential hypertension   [facility] Glean Hess, MD   2 years ago Essential hypertension   [facility] Glean Hess, MD      Future Appointments            In 1 month Army Melia, Jesse Sans, MD [facility], [facility]

## 2020-01-07 ENCOUNTER — Encounter: Payer: Medicare HMO | Admitting: Internal Medicine

## 2020-01-07 ENCOUNTER — Ambulatory Visit (INDEPENDENT_AMBULATORY_CARE_PROVIDER_SITE_OTHER): Payer: Medicare HMO | Admitting: Internal Medicine

## 2020-01-07 ENCOUNTER — Other Ambulatory Visit: Payer: Self-pay

## 2020-01-07 ENCOUNTER — Encounter: Payer: Self-pay | Admitting: Internal Medicine

## 2020-01-07 VITALS — BP 138/84 | HR 56 | Temp 98.2°F | Ht 69.0 in | Wt 174.0 lb

## 2020-01-07 DIAGNOSIS — Z8546 Personal history of malignant neoplasm of prostate: Secondary | ICD-10-CM | POA: Diagnosis not present

## 2020-01-07 DIAGNOSIS — R600 Localized edema: Secondary | ICD-10-CM

## 2020-01-07 DIAGNOSIS — I10 Essential (primary) hypertension: Secondary | ICD-10-CM

## 2020-01-07 DIAGNOSIS — I700 Atherosclerosis of aorta: Secondary | ICD-10-CM | POA: Diagnosis not present

## 2020-01-07 DIAGNOSIS — Z8673 Personal history of transient ischemic attack (TIA), and cerebral infarction without residual deficits: Secondary | ICD-10-CM

## 2020-01-07 DIAGNOSIS — E785 Hyperlipidemia, unspecified: Secondary | ICD-10-CM

## 2020-01-07 DIAGNOSIS — Z0000 Encounter for general adult medical examination without abnormal findings: Secondary | ICD-10-CM

## 2020-01-07 DIAGNOSIS — I350 Nonrheumatic aortic (valve) stenosis: Secondary | ICD-10-CM

## 2020-01-07 LAB — POCT URINALYSIS DIPSTICK
Bilirubin, UA: NEGATIVE
Glucose, UA: NEGATIVE
Ketones, UA: NEGATIVE
Leukocytes, UA: NEGATIVE
Nitrite, UA: NEGATIVE
Protein, UA: NEGATIVE
Spec Grav, UA: >=1.03 — AB (ref 1.010–1.025)
Urobilinogen, UA: 0.2 E.U./dL
pH, UA: 5 (ref 5.0–8.0)

## 2020-01-07 MED ORDER — LISINOPRIL 20 MG PO TABS: 20.0000 mg | ORAL_TABLET | Freq: Every day | ORAL | 3 refills | Status: DC

## 2020-01-07 MED ORDER — ATORVASTATIN CALCIUM 80 MG PO TABS: 80.0000 mg | ORAL_TABLET | Freq: Every day | ORAL | 3 refills | Status: DC

## 2020-01-07 MED ORDER — CLOPIDOGREL BISULFATE 75 MG PO TABS: 75.0000 mg | ORAL_TABLET | Freq: Every day | ORAL | 3 refills | Status: DC

## 2020-01-07 NOTE — Patient Instructions (Addendum)
You could try Melatonin for sleep. It is over the counter.  I have referred you to cardiology to follow up on the heart murmur and aortic valve stenosis.  Dr. Alveria Apley office will be calling you soon.

## 2020-01-07 NOTE — Progress Notes (Signed)
Date:  01/07/2020   Name:  [name]   DOB:  05/17/1935   MRN:  [id number]   Chief Complaint: Annual Exam  [name] is a 84 y.o. male who presents today for his Complete Annual Exam. He feels well. He reports exercising plays golf when he cans. He reports he is sleeping poorly which is a chronic situation.   Colonoscopy: aged out  Immunization History  Administered Date(s) Administered  . PFIZER SARS-COV-2 Vaccination 05/26/2019, 06/16/2019    Hypertension This is a chronic problem. The problem is controlled. Pertinent negatives include no chest pain, headaches, palpitations or shortness of breath. Past treatments include ACE inhibitors. The current treatment provides significant improvement.  Hyperlipidemia This is a chronic problem. The problem is controlled. Pertinent negatives include no chest pain, myalgias or shortness of breath. Current antihyperlipidemic treatment includes statins. The current treatment provides significant improvement of lipids.    Lab Results  Component Value Date   CREATININE 0.85 01/02/2019   BUN 16 01/02/2019   NA 140 01/02/2019   K 4.8 01/02/2019   CL 107 (H) 01/02/2019   CO2 21 01/02/2019   Lab Results  Component Value Date   CHOL 112 01/02/2019   HDL 50 01/02/2019   LDLCALC 49 01/02/2019   TRIG 57 01/02/2019   CHOLHDL 2.2 01/02/2019   Lab Results  Component Value Date   TSH 1.520 06/28/2017   No results found for: HGBA1C Lab Results  Component Value Date   WBC 3.6 01/02/2019   HGB 13.9 01/02/2019   HCT 41.2 01/02/2019   MCV 96 01/02/2019   PLT 154 01/02/2019   Lab Results  Component Value Date   ALT 14 01/02/2019   AST 20 01/02/2019   ALKPHOS 71 01/02/2019   BILITOT 0.4 01/02/2019     Review of Systems  Constitutional: Negative for appetite change, chills, diaphoresis, fatigue and unexpected weight change.  HENT: Positive for hearing loss. Negative for tinnitus and trouble swallowing.   Eyes: Negative for visual  disturbance.  Respiratory: Negative for choking, chest tightness, shortness of breath and wheezing.   Cardiovascular: Positive for leg swelling. Negative for chest pain and palpitations.  Gastrointestinal: Negative for abdominal pain, blood in stool, constipation and diarrhea.  Genitourinary: Negative for difficulty urinating, dysuria and frequency.  Musculoskeletal: Negative for arthralgias, back pain and myalgias.  Skin: Negative for color change and rash.  Neurological: Negative for dizziness, syncope and headaches.  Hematological: Negative for adenopathy.  Psychiatric/Behavioral: Positive for sleep disturbance. Negative for dysphoric mood. The patient is not nervous/anxious.     Patient Active Problem List   Diagnosis Date Noted  . Moderate aortic valve stenosis 12/12/2016  . Aortic atherosclerosis ([place]) 12/12/2016  . Seasonal allergies 07/14/2016  . Hypertension 07/14/2016  . Personal history of prostate cancer 11/23/2014  . Dyslipidemia 09/03/2014  . Edema of extremities 09/03/2014  . History of CVA (cerebrovascular accident) without residual deficits 03/31/2012    No Known Allergies  Past Surgical History:  Procedure Laterality Date  . INGUINAL HERNIA REPAIR Left     Social History   Tobacco Use  . Smoking status: Former Smoker    Packs/day: 0.50    Years: 50.00    Pack years: 25.00    Types: Cigarettes    Quit date: 05/02/2011    Years since quitting: 8.6  . Smokeless tobacco: Never Used  . Tobacco comment: smoking cessation materials not required  Vaping Use  . Vaping Use: Never used  Substance Use Topics  .  Alcohol use: No    Alcohol/week: 0.0 standard drinks  . Drug use: No     Medication list has been reviewed and updated.  Current Meds  Medication Sig  . aspirin 81 MG tablet Take 1 tablet by mouth 2 (two) times a week.   Marland Kitchen atorvastatin (LIPITOR) 80 MG tablet Take 1 tablet (80 mg total) by mouth daily.  . clopidogrel (PLAVIX) 75 MG tablet Take 1  tablet (75 mg total) by mouth daily.  Marland Kitchen lisinopril (ZESTRIL) 20 MG tablet Take 1 tablet (20 mg total) by mouth daily.  . vitamin B-12 (CYANOCOBALAMIN) 1000 MCG tablet Take 1,000 mcg by mouth 2 (two) times a week. Every other day  . [DISCONTINUED] atorvastatin (LIPITOR) 80 MG tablet Take 1 tablet (80 mg total) by mouth daily.  . [DISCONTINUED] clopidogrel (PLAVIX) 75 MG tablet TAKE 1 TABLET DAILY  . [DISCONTINUED] lisinopril (ZESTRIL) 20 MG tablet Take 1 tablet (20 mg total) by mouth daily.    PHQ 2/9 Scores 07/03/2019 03/19/2019 01/02/2019 01/02/2018  PHQ - 2 Score 0 0 0 0  PHQ- 9 Score 0 - - 0    No flowsheet data found.  BP Readings from Last 3 Encounters:  01/07/20 138/84  07/03/19 124/80  03/19/19 (!) 159/79    Physical Exam Vitals and nursing note reviewed.  Constitutional:      Appearance: Normal appearance. He is well-developed.  HENT:     Head: Normocephalic.     Right Ear: Tympanic membrane, ear canal and external ear normal.     Left Ear: Tympanic membrane, ear canal and external ear normal.     Nose: Nose normal.     Mouth/Throat:     Pharynx: Uvula midline.  Eyes:     Conjunctiva/sclera: Conjunctivae normal.     Pupils: Pupils are equal, round, and reactive to light.  Neck:     Thyroid: No thyromegaly.     Vascular: No carotid bruit.  Cardiovascular:     Rate and Rhythm: Normal rate and regular rhythm. Occasional extrasystoles are present.    Pulses: Normal pulses.     Heart sounds: Murmur heard.  Systolic murmur is present with a grade of 4/6.   Pulmonary:     Effort: Pulmonary effort is normal.     Breath sounds: Normal breath sounds. No wheezing.  Chest:     Breasts:        Right: No mass.        Left: No mass.  Abdominal:     General: Bowel sounds are normal.     Palpations: Abdomen is soft.     Tenderness: There is no abdominal tenderness.  Musculoskeletal:        General: Normal range of motion.     Cervical back: Normal range of motion and neck  supple.     Right lower leg: 2+ Pitting Edema present.     Left lower leg: 2+ Pitting Edema present.  Lymphadenopathy:     Cervical: No cervical adenopathy.  Skin:    General: Skin is warm and dry.  Neurological:     Mental Status: He is alert and oriented to person, place, and time.     Deep Tendon Reflexes: Reflexes are normal and symmetric.  Psychiatric:        Speech: Speech normal.        Behavior: Behavior normal.        Thought Content: Thought content normal.        Judgment: Judgment normal.  Wt Readings from Last 3 Encounters:  01/07/20 174 lb (78.9 kg)  07/03/19 179 lb (81.2 kg)  03/19/19 178 lb (80.7 kg)    BP 138/84   Pulse (!) 56   Temp 98.2 F (36.8 C) (Oral)   Ht 5\' 9"  (1.753 m)   Wt 174 lb (78.9 kg)   SpO2 97%   BMI 25.70 kg/m   Assessment and Plan: 1. Annual physical exam Normal exam for age Continue healthy diet and regular exercise - POCT urinalysis dipstick  2. Essential hypertension Clinically stable exam with well controlled BP. Tolerating medications without side effects at this time. Pt to continue current regimen and low sodium diet; benefits of regular exercise as able discussed. - CBC with Differential/Platelet - Comprehensive metabolic panel - lisinopril (ZESTRIL) 20 MG tablet; Take 1 tablet (20 mg total) by mouth daily.  Dispense: 90 tablet; Refill: 3  3. Dyslipidemia - Lipid panel - atorvastatin (LIPITOR) 80 MG tablet; Take 1 tablet (80 mg total) by mouth daily.  Dispense: 90 tablet; Refill: 3  4. Aortic atherosclerosis ([facility]) On statin and aspirin  5. History of CVA (cerebrovascular accident) without residual deficits On life long plavix; no bleeding issues other than senile ecchymoses - clopidogrel (PLAVIX) 75 MG tablet; Take 1 tablet (75 mg total) by mouth daily.  Dispense: 90 tablet; Refill: 3  6. Personal history of prostate cancer No longer seeing Rad Onc or Urology - PSA  7. Edema of extremities Continue elevation  as needed Would benefit from mild support stockings  8. Moderate aortic valve stenosis Murmur is louder today - pt denies sx but has not seen cardiology in about three years - Ambulatory referral to Cardiology   Partially dictated using [place]. Any errors are unintentional.  Halina Maidens, MD [facility]  01/07/2020

## 2020-01-08 LAB — LIPID PANEL
Chol/HDL Ratio: 2.3 ratio (ref 0.0–5.0)
Cholesterol, Total: 115 mg/dL (ref 100–199)
HDL: 49 mg/dL (ref >39)
LDL Chol Calc (NIH): 53 mg/dL (ref 0–99)
Triglycerides: 59 mg/dL (ref 0–149)
VLDL Cholesterol Cal: 13 mg/dL (ref 5–40)

## 2020-01-08 LAB — COMPREHENSIVE METABOLIC PANEL
ALT: 17 IU/L (ref 0–44)
AST: 18 IU/L (ref 0–40)
Albumin/Globulin Ratio: 1.7 (ref 1.2–2.2)
Albumin: 4 g/dL (ref 3.6–4.6)
Alkaline Phosphatase: 83 IU/L (ref 48–121)
BUN/Creatinine Ratio: 15 (ref 10–24)
BUN: 13 mg/dL (ref 8–27)
Bilirubin Total: 0.4 mg/dL (ref 0.0–1.2)
CO2: 24 mmol/L (ref 20–29)
Calcium: 9.5 mg/dL (ref 8.6–10.2)
Chloride: 106 mmol/L (ref 96–106)
Creatinine, Ser: 0.84 mg/dL (ref 0.76–1.27)
GFR calc Af Amer: 94 mL/min/{1.73_m2} (ref >59)
GFR calc non Af Amer: 82 mL/min/{1.73_m2} (ref >59)
Globulin, Total: 2.4 g/dL (ref 1.5–4.5)
Glucose: 84 mg/dL (ref 65–99)
Potassium: 5.7 mmol/L — ABNORMAL HIGH (ref 3.5–5.2)
Sodium: 141 mmol/L (ref 134–144)
Total Protein: 6.4 g/dL (ref 6.0–8.5)

## 2020-01-08 LAB — CBC WITH DIFFERENTIAL/PLATELET
Basophils Absolute: 0 10*3/uL (ref 0.0–0.2)
Basos: 1 %
EOS (ABSOLUTE): 0.1 10*3/uL (ref 0.0–0.4)
Eos: 2 %
Hematocrit: 43.1 % (ref 37.5–51.0)
Hemoglobin: 14.3 g/dL (ref 13.0–17.7)
Immature Grans (Abs): 0 10*3/uL (ref 0.0–0.1)
Immature Granulocytes: 0 %
Lymphocytes Absolute: 1 10*3/uL (ref 0.7–3.1)
Lymphs: 24 %
MCH: 32.4 pg (ref 26.6–33.0)
MCHC: 33.2 g/dL (ref 31.5–35.7)
MCV: 98 fL — ABNORMAL HIGH (ref 79–97)
Monocytes Absolute: 0.3 10*3/uL (ref 0.1–0.9)
Monocytes: 7 %
Neutrophils Absolute: 2.8 10*3/uL (ref 1.4–7.0)
Neutrophils: 66 %
Platelets: 164 10*3/uL (ref 150–450)
RBC: 4.42 x10E6/uL (ref 4.14–5.80)
RDW: 12.3 % (ref 11.6–15.4)
WBC: 4.3 10*3/uL (ref 3.4–10.8)

## 2020-01-08 LAB — PSA: Prostate Specific Ag, Serum: <0.1 ng/mL (ref 0.0–4.0)

## 2020-02-10 ENCOUNTER — Telehealth: Payer: Self-pay

## 2020-02-10 NOTE — Telephone Encounter (Signed)
Copied from [place] [id number]. Topic: General - Other >> Feb 10, 2020  1:40 PM Celene Kras wrote: Reason for CRM: Baker Janus, from [facility] clinic cardiology, called and is requesting to have copies of the pts most recent EKG. Please advise.    Fax# [phone]

## 2020-02-11 DIAGNOSIS — G451 Carotid artery syndrome (hemispheric): Secondary | ICD-10-CM | POA: Diagnosis not present

## 2020-02-11 DIAGNOSIS — I350 Nonrheumatic aortic (valve) stenosis: Secondary | ICD-10-CM | POA: Diagnosis not present

## 2020-02-11 DIAGNOSIS — I10 Essential (primary) hypertension: Secondary | ICD-10-CM | POA: Diagnosis not present

## 2020-02-11 DIAGNOSIS — E7800 Pure hypercholesterolemia, unspecified: Secondary | ICD-10-CM | POA: Diagnosis not present

## 2020-02-11 DIAGNOSIS — Z23 Encounter for immunization: Secondary | ICD-10-CM | POA: Diagnosis not present

## 2020-02-11 DIAGNOSIS — I700 Atherosclerosis of aorta: Secondary | ICD-10-CM | POA: Diagnosis not present

## 2020-02-11 DIAGNOSIS — R001 Bradycardia, unspecified: Secondary | ICD-10-CM | POA: Diagnosis not present

## 2020-02-11 NOTE — Telephone Encounter (Signed)
Faxed last EKG from 2013 to [facility]. This is the only EKG we have on file.   CM

## 2020-03-16 DIAGNOSIS — Z8673 Personal history of transient ischemic attack (TIA), and cerebral infarction without residual deficits: Secondary | ICD-10-CM | POA: Diagnosis not present

## 2020-03-16 DIAGNOSIS — G451 Carotid artery syndrome (hemispheric): Secondary | ICD-10-CM | POA: Diagnosis not present

## 2020-03-16 DIAGNOSIS — I6523 Occlusion and stenosis of bilateral carotid arteries: Secondary | ICD-10-CM | POA: Diagnosis not present

## 2020-03-16 DIAGNOSIS — E785 Hyperlipidemia, unspecified: Secondary | ICD-10-CM | POA: Diagnosis not present

## 2020-03-16 DIAGNOSIS — I10 Essential (primary) hypertension: Secondary | ICD-10-CM | POA: Diagnosis not present

## 2020-03-16 DIAGNOSIS — I350 Nonrheumatic aortic (valve) stenosis: Secondary | ICD-10-CM | POA: Diagnosis not present

## 2020-04-06 DIAGNOSIS — I350 Nonrheumatic aortic (valve) stenosis: Secondary | ICD-10-CM | POA: Diagnosis not present

## 2020-04-28 DIAGNOSIS — Z23 Encounter for immunization: Secondary | ICD-10-CM | POA: Diagnosis not present

## 2020-04-28 DIAGNOSIS — I6523 Occlusion and stenosis of bilateral carotid arteries: Secondary | ICD-10-CM | POA: Insufficient documentation

## 2020-04-28 DIAGNOSIS — I10 Essential (primary) hypertension: Secondary | ICD-10-CM | POA: Diagnosis not present

## 2020-04-28 DIAGNOSIS — I350 Nonrheumatic aortic (valve) stenosis: Secondary | ICD-10-CM | POA: Diagnosis not present

## 2020-04-28 DIAGNOSIS — I700 Atherosclerosis of aorta: Secondary | ICD-10-CM | POA: Diagnosis not present

## 2020-04-28 DIAGNOSIS — E7800 Pure hypercholesterolemia, unspecified: Secondary | ICD-10-CM | POA: Diagnosis not present

## 2020-04-28 DIAGNOSIS — R001 Bradycardia, unspecified: Secondary | ICD-10-CM | POA: Diagnosis not present

## 2020-05-05 ENCOUNTER — Ambulatory Visit (INDEPENDENT_AMBULATORY_CARE_PROVIDER_SITE_OTHER): Payer: Medicare HMO

## 2020-05-05 ENCOUNTER — Other Ambulatory Visit: Payer: Self-pay

## 2020-05-05 DIAGNOSIS — Z0000 Encounter for general adult medical examination without abnormal findings: Secondary | ICD-10-CM

## 2020-05-05 NOTE — Patient Instructions (Signed)
[name] , Thank you for taking time to come for your Medicare Wellness Visit. I appreciate your ongoing commitment to your health goals. Please review the following plan we discussed and let me know if I can assist you in the future.   Screening recommendations/referrals: Colonoscopy: no longer required Recommended yearly ophthalmology/optometry visit for glaucoma screening and checkup Recommended yearly dental visit for hygiene and checkup  Vaccinations: Influenza vaccine: declined Pneumococcal vaccine: declined Tdap vaccine: due Shingles vaccine: Shingrix discussed. Please contact your pharmacy for coverage information.  Covid-19: done 05/26/19 & 06/16/19; due for booster  Conditions/risks identified: Recommend drinking 6-8 glasses of water per day   Next appointment: Follow up in one year for your annual wellness visit.   Preventive Care 20 Years and Older, Male Preventive care refers to lifestyle choices and visits with your health care provider that can promote health and wellness. What does preventive care include?  A yearly physical exam. This is also called an annual well check.  Dental exams once or twice a year.  Routine eye exams. Ask your health care provider how often you should have your eyes checked.  Personal lifestyle choices, including:  Daily care of your teeth and gums.  Regular physical activity.  Eating a healthy diet.  Avoiding tobacco and drug use.  Limiting alcohol use.  Practicing safe sex.  Taking low doses of aspirin every day.  Taking vitamin and mineral supplements as recommended by your health care provider. What happens during an annual well check? The services and screenings done by your health care provider during your annual well check will depend on your age, overall health, lifestyle risk factors, and family history of disease. Counseling  Your health care provider may ask you questions about your:  Alcohol use.  Tobacco  use.  Drug use.  Emotional well-being.  Home and relationship well-being.  Sexual activity.  Eating habits.  History of falls.  Memory and ability to understand (cognition).  Work and work Astronomer. Screening  You may have the following tests or measurements:  Height, weight, and BMI.  Blood pressure.  Lipid and cholesterol levels. These may be checked every 5 years, or more frequently if you are over 38 years old.  Skin check.  Lung cancer screening. You may have this screening every year starting at age 63 if you have a 30-pack-year history of smoking and currently smoke or have quit within the past 15 years.  Fecal occult blood test (FOBT) of the stool. You may have this test every year starting at age 85.  Flexible sigmoidoscopy or colonoscopy. You may have a sigmoidoscopy every 5 years or a colonoscopy every 10 years starting at age 97.  Prostate cancer screening. Recommendations will vary depending on your family history and other risks.  Hepatitis C blood test.  Hepatitis B blood test.  Sexually transmitted disease (STD) testing.  Diabetes screening. This is done by checking your blood sugar (glucose) after you have not eaten for a while (fasting). You may have this done every 1-3 years.  Abdominal aortic aneurysm (AAA) screening. You may need this if you are a current or former smoker.  Osteoporosis. You may be screened starting at age 89 if you are at high risk. Talk with your health care provider about your test results, treatment options, and if necessary, the need for more tests. Vaccines  Your health care provider may recommend certain vaccines, such as:  Influenza vaccine. This is recommended every year.  Tetanus, diphtheria, and acellular  pertussis (Tdap, Td) vaccine. You may need a Td booster every 10 years.  Zoster vaccine. You may need this after age 60.  Pneumococcal 13-valent conjugate (PCV13) vaccine. One dose is recommended after age  58.  Pneumococcal polysaccharide (PPSV23) vaccine. One dose is recommended after age 57. Talk to your health care provider about which screenings and vaccines you need and how often you need them. This information is not intended to replace advice given to you by your health care provider. Make sure you discuss any questions you have with your health care provider. Document Released: 05/14/2015 Document Revised: 01/05/2016 Document Reviewed: 02/16/2015 Elsevier Interactive Patient Education  2017 [place] Prevention in the Home Falls can cause injuries. They can happen to people of all ages. There are many things you can do to make your home safe and to help prevent falls. What can I do on the outside of my home?  Regularly fix the edges of walkways and driveways and fix any cracks.  Remove anything that might make you trip as you walk through a door, such as a raised step or threshold.  Trim any bushes or trees on the path to your home.  Use bright outdoor lighting.  Clear any walking paths of anything that might make someone trip, such as rocks or tools.  Regularly check to see if handrails are loose or broken. Make sure that both sides of any steps have handrails.  Any raised decks and porches should have guardrails on the edges.  Have any leaves, snow, or ice cleared regularly.  Use sand or salt on walking paths during winter.  Clean up any spills in your garage right away. This includes oil or grease spills. What can I do in the bathroom?  Use night lights.  Install grab bars by the toilet and in the tub and shower. Do not use towel bars as grab bars.  Use non-skid mats or decals in the tub or shower.  If you need to sit down in the shower, use a plastic, non-slip stool.  Keep the floor dry. Clean up any water that spills on the floor as soon as it happens.  Remove soap buildup in the tub or shower regularly.  Attach bath mats securely with double-sided  non-slip rug tape.  Do not have throw rugs and other things on the floor that can make you trip. What can I do in the bedroom?  Use night lights.  Make sure that you have a light by your bed that is easy to reach.  Do not use any sheets or blankets that are too big for your bed. They should not hang down onto the floor.  Have a firm chair that has side arms. You can use this for support while you get dressed.  Do not have throw rugs and other things on the floor that can make you trip. What can I do in the kitchen?  Clean up any spills right away.  Avoid walking on wet floors.  Keep items that you use a lot in easy-to-reach places.  If you need to reach something above you, use a strong step stool that has a grab bar.  Keep electrical cords out of the way.  Do not use floor polish or wax that makes floors slippery. If you must use wax, use non-skid floor wax.  Do not have throw rugs and other things on the floor that can make you trip. What can I do with my stairs?  Do not leave any items on the stairs.  Make sure that there are handrails on both sides of the stairs and use them. Fix handrails that are broken or loose. Make sure that handrails are as long as the stairways.  Check any carpeting to make sure that it is firmly attached to the stairs. Fix any carpet that is loose or worn.  Avoid having throw rugs at the top or bottom of the stairs. If you do have throw rugs, attach them to the floor with carpet tape.  Make sure that you have a light switch at the top of the stairs and the bottom of the stairs. If you do not have them, ask someone to add them for you. What else can I do to help prevent falls?  Wear shoes that:  Do not have high heels.  Have rubber bottoms.  Are comfortable and fit you well.  Are closed at the toe. Do not wear sandals.  If you use a stepladder:  Make sure that it is fully opened. Do not climb a closed stepladder.  Make sure that both  sides of the stepladder are locked into place.  Ask someone to hold it for you, if possible.  Clearly mark and make sure that you can see:  Any grab bars or handrails.  First and last steps.  Where the edge of each step is.  Use tools that help you move around (mobility aids) if they are needed. These include:  Canes.  Walkers.  Scooters.  Crutches.  Turn on the lights when you go into a dark area. Replace any light bulbs as soon as they burn out.  Set up your furniture so you have a clear path. Avoid moving your furniture around.  If any of your floors are uneven, fix them.  If there are any pets around you, be aware of where they are.  Review your medicines with your doctor. Some medicines can make you feel dizzy. This can increase your chance of falling. Ask your doctor what other things that you can do to help prevent falls. This information is not intended to replace advice given to you by your health care provider. Make sure you discuss any questions you have with your health care provider. Document Released: 02/11/2009 Document Revised: 09/23/2015 Document Reviewed: 05/22/2014 Elsevier Interactive Patient Education  2017 [employer].

## 2020-05-05 NOTE — Progress Notes (Signed)
Subjective:   [name] is a 85 y.o. male who presents for Medicare Annual/Subsequent preventive examination.  Virtual Visit via Video Note  I connected with  [name] on 05/05/20 at  9:20 AM EST via telehealth video enabled device and verified that I am speaking with the correct person using two identifiers.  Location: Patient: home Provider: [facility] Persons participating in the virtual visit: patient & wife [facility] Advisor   I discussed the limitations, risks, security and privacy concerns of performing an evaluation and management service by telephone and the availability of in person appointments. The patient expressed understanding and agreed to proceed.  Some vital signs may be absent or patient reported.   Jimmy Marker, LPN    Review of Systems     Cardiac Risk Factors include: advanced age (>59men, >38 women);dyslipidemia;hypertension;male gender     Objective:    There were no vitals filed for this visit. There is no height or weight on file to calculate BMI.  Advanced Directives 05/05/2020 03/19/2019 05/30/2018 01/02/2018 05/31/2017 02/12/2017 05/11/2016  Does Patient Have a Medical Advance Directive? Yes Yes No Yes No Yes No  Type of Paramedic of [place];Living will [place];Living will - [place];Living will - [place];Living will -  Does patient want to make changes to medical advance directive? - - - - - - -  Copy of [place] in Chart? Yes - validated most recent copy scanned in chart (See row information) Yes - validated most recent copy scanned in chart (See row information) - Yes - No - copy requested -  Would patient like information on creating a medical advance directive? - - No - Patient declined - - - No - Patient declined    Current Medications (verified) Outpatient Encounter Medications as of 05/05/2020  Medication Sig  . aspirin 81 MG  tablet Take 1 tablet by mouth 2 (two) times a week.   Marland Kitchen atorvastatin (LIPITOR) 80 MG tablet Take 1 tablet (80 mg total) by mouth daily.  . clopidogrel (PLAVIX) 75 MG tablet Take 1 tablet (75 mg total) by mouth daily.  Marland Kitchen lisinopril (ZESTRIL) 20 MG tablet Take 1 tablet (20 mg total) by mouth daily.  . vitamin B-12 (CYANOCOBALAMIN) 1000 MCG tablet Take 1,000 mcg by mouth 2 (two) times a week. Every other day   No facility-administered encounter medications on file as of 05/05/2020.    Allergies (verified) Patient has no known allergies.   History: Past Medical History:  Diagnosis Date  . Edema extremities   . Elevated PSA   . HLD (hyperlipidemia)   . Hypertension   . Kidney stones   . Prostate cancer ([place])   . Stroke [facility])    Past Surgical History:  Procedure Laterality Date  . INGUINAL HERNIA REPAIR Left    Family History  Problem Relation Age of Onset  . Parkinson's disease Mother   . Narcolepsy Mother   . Stroke Brother   . Prostate cancer Brother   . Congestive Heart Failure Brother   . Heart disease Brother   . Alzheimer's disease Daughter   . Kidney disease Neg Hx   . Kidney cancer Neg Hx   . Bladder Cancer Neg Hx    Social History   Socioeconomic History  . Marital status: Married    Spouse name: Not on file  . Number of children: 2  . Years of education: Not on file  . Highest  education level: GED or equivalent  Occupational History  . Occupation: Retired  Tobacco Use  . Smoking status: Former Smoker    Packs/day: 0.50    Years: 50.00    Pack years: 25.00    Types: Cigarettes    Quit date: 05/02/2011    Years since quitting: 9.0  . Smokeless tobacco: Never Used  . Tobacco comment: smoking cessation materials not required  Vaping Use  . Vaping Use: Never used  Substance and Sexual Activity  . Alcohol use: No    Alcohol/week: 0.0 standard drinks  . Drug use: No  . Sexual activity: Not Currently    Birth control/protection: None  Other Topics Concern   . Not on file  Social History Narrative  . Not on file   Social Determinants of Health   Financial Resource Strain: Low Risk   . Difficulty of Paying Living Expenses: Not hard at all  Food Insecurity: No Food Insecurity  . Worried About Programme researcher, broadcasting/film/video in the Last Year: Never true  . Ran Out of Food in the Last Year: Never true  Transportation Needs: No Transportation Needs  . Lack of Transportation (Medical): No  . Lack of Transportation (Non-Medical): No  Physical Activity: Inactive  . Days of Exercise per Week: 0 days  . Minutes of Exercise per Session: 0 min  Stress: No Stress Concern Present  . Feeling of Stress : Not at all  Social Connections: Moderately Integrated  . Frequency of Communication with Friends and Family: More than three times a week  . Frequency of Social Gatherings with Friends and Family: Three times a week  . Attends Religious Services: More than 4 times per year  . Active Member of Clubs or Organizations: No  . Attends Banker Meetings: Never  . Marital Status: Married    Tobacco Counseling Counseling given: Not Answered Comment: smoking cessation materials not required   Clinical Intake:  Pre-visit preparation completed: Yes  Pain : No/denies pain     Nutritional Risks: None Diabetes: No  How often do you need to have someone help you when you read instructions, pamphlets, or other written materials from your doctor or pharmacy?: 1 - Never    Interpreter Needed?: No  Information entered by :: Jimmy Littler LPN   Activities of Daily Living In your present state of health, do you have any difficulty performing the following activities: 05/05/2020  Hearing? Y  Comment declines hearing aids  Vision? N  Difficulty concentrating or making decisions? N  Walking or climbing stairs? N  Dressing or bathing? N  Doing errands, shopping? N  Preparing Food and eating ? N  Using the Toilet? N  In the past six months, have you  accidently leaked urine? N  Do you have problems with loss of bowel control? N  Managing your Medications? N  Managing your Finances? N  Housekeeping or managing your Housekeeping? N  Some recent data might be hidden    Patient Care Team: Reubin Milan, MD as PCP - General (Internal Medicine) Carmina Miller, MD as Referring Physician (Radiation Oncology) Lamar Blinks, MD as Consulting Physician (Cardiology)  Indicate any recent Medical Services you may have received from other than [facility] providers in the past year (date may be approximate).     Assessment:   This is a routine wellness examination for [place].  Hearing/Vision screen  Hearing Screening   125Hz  250Hz  500Hz  1000Hz  2000Hz  3000Hz  4000Hz  6000Hz  8000Hz   Right ear:  Left ear:           Comments: Pt c/o mild hearing difficulty, declines hearing aids   Vision Screening Comments: Past due for eye exam. Declines referral for eye exam. Only wears reading glasses  Dietary issues and exercise activities discussed: Current Exercise Habits: The patient does not participate in regular exercise at present, Exercise limited by: None identified  Goals    . DIET - INCREASE WATER INTAKE     Recommend to drink at least 6-8 8oz glasses of water per day.    . Prevent Falls     Recommend to remove items from your home that may cause you to trip or slip      Depression Screen PHQ 2/9 Scores 05/05/2020 07/03/2019 03/19/2019 01/02/2019 01/02/2018 12/27/2017 02/12/2017  PHQ - 2 Score 0 0 0 0 0 0 0  PHQ- 9 Score - 0 - - 0 0 -    Fall Risk Fall Risk  05/05/2020 07/03/2019 03/19/2019 01/02/2019 01/02/2018  Falls in the past year? 0 0 0 0 Yes  Comment - - - - -  Number falls in past yr: 0 0 0 0 1  Comment - - - - -  Injury with Fall? 0 0 0 0 Yes  Comment - - - - broken ribs  Risk for fall due to : No Fall Risks - - - Impaired vision  Risk for fall due to: Comment - - - - wears eyeglasses  Follow up Falls prevention discussed Falls  evaluation completed Falls prevention discussed Falls evaluation completed Falls evaluation completed;Education provided;Falls prevention discussed    FALL RISK PREVENTION PERTAINING TO THE HOME:  Any stairs in or around the home? Yes  If so, are there any without handrails? No  Home free of loose throw rugs in walkways, pet beds, electrical cords, etc? Yes  Adequate lighting in your home to reduce risk of falls? Yes   ASSISTIVE DEVICES UTILIZED TO PREVENT FALLS:  Life alert? No  Use of a cane, walker or w/c? No  Grab bars in the bathroom? Yes  Shower chair or bench in shower? No  Elevated toilet seat or a handicapped toilet? No   TIMED UP AND GO:  Was the test performed? No . Telephonic visit.   Cognitive Function: Normal cognitive status assessed by direct observation by this Nurse Health Advisor. No abnormalities found.       6CIT Screen 01/02/2019 01/02/2018  What Year? 0 points 0 points  What month? 0 points 0 points  What time? 0 points 0 points  Count back from 20 0 points 0 points  Months in reverse 0 points 0 points  Repeat phrase 0 points 2 points  Total Score 0 2    Immunizations Immunization History  Administered Date(s) Administered  . PFIZER SARS-COV-2 Vaccination 05/26/2019, 06/16/2019    TDAP status: Due, Education has been provided regarding the importance of this vaccine. Advised may receive this vaccine at local pharmacy or Health Dept. Aware to provide a copy of the vaccination record if obtained from local pharmacy or Health Dept. Verbalized acceptance and understanding.  Flu Vaccine status: Declined, Education has been provided regarding the importance of this vaccine but patient still declined. Advised may receive this vaccine at local pharmacy or Health Dept. Aware to provide a copy of the vaccination record if obtained from local pharmacy or Health Dept. Verbalized acceptance and understanding.  Pneumococcal vaccine status: Declined,  Education has  been provided regarding the importance of this vaccine  but patient still declined. Advised may receive this vaccine at local pharmacy or Health Dept. Aware to provide a copy of the vaccination record if obtained from local pharmacy or Health Dept. Verbalized acceptance and understanding.   Covid-19 vaccine status: Completed vaccines  Qualifies for Shingles Vaccine? Yes   Zostavax completed No   Shingrix Completed?: No.    Education has been provided regarding the importance of this vaccine. Patient has been advised to call insurance company to determine out of pocket expense if they have not yet received this vaccine. Advised may also receive vaccine at local pharmacy or Health Dept. Verbalized acceptance and understanding.  Screening Tests Health Maintenance  Topic Date Due  . COVID-19 Vaccine (3 - Pfizer risk 4-dose series) 07/14/2019  . TETANUS/TDAP  01/06/2021 (Originally 05/27/1954)  . INFLUENZA VACCINE  05/08/2021 (Originally 11/30/2019)  . PNA vac Low Risk Adult  Discontinued    Health Maintenance  Health Maintenance Due  Topic Date Due  . COVID-19 Vaccine (3 - Pfizer risk 4-dose series) 07/14/2019    Colorectal cancer screening: No longer required.   Lung Cancer Screening: (Low Dose CT Chest recommended if Age 1-80 years, 30 pack-year currently smoking OR have quit w/in 15years.) does not qualify.   Additional Screening:  Hepatitis C Screening: does not qualify.  Vision Screening: Recommended annual ophthalmology exams for early detection of glaucoma and other disorders of the eye. Is the patient up to date with their annual eye exam?  No  Who is the provider or what is the name of the office in which the patient attends annual eye exams? Not established  Dental Screening: Recommended annual dental exams for proper oral hygiene  Community Resource Referral / Chronic Care Management: CRR required this visit?  No   CCM required this visit?  No      Plan:     I have  personally reviewed and noted the following in the patient's chart:   . Medical and social history . Use of alcohol, tobacco or illicit drugs  . Current medications and supplements . Functional ability and status . Nutritional status . Physical activity . Advanced directives . List of other physicians . Hospitalizations, surgeries, and ER visits in previous 12 months . Vitals . Screenings to include cognitive, depression, and falls . Referrals and appointments  In addition, I have reviewed and discussed with patient certain preventive protocols, quality metrics, and best practice recommendations. A written personalized care plan for preventive services as well as general preventive health recommendations were provided to patient.     Jimmy Littler, LPN   0/11/1446   Nurse Notes: pt's wife said [facility] stated they need new rx for lisinopril but patient has plenty on hand right now; advised rx sent on 01/07/20 and may be on file or have pharmacy contact us.

## 2020-06-01 DIAGNOSIS — Z20828 Contact with and (suspected) exposure to other viral communicable diseases: Secondary | ICD-10-CM | POA: Diagnosis not present

## 2020-07-08 ENCOUNTER — Ambulatory Visit (INDEPENDENT_AMBULATORY_CARE_PROVIDER_SITE_OTHER): Payer: Medicare HMO | Admitting: Internal Medicine

## 2020-07-08 ENCOUNTER — Ambulatory Visit: Payer: Medicare HMO | Admitting: Internal Medicine

## 2020-07-08 ENCOUNTER — Other Ambulatory Visit: Payer: Self-pay

## 2020-07-08 ENCOUNTER — Encounter: Payer: Self-pay | Admitting: Internal Medicine

## 2020-07-08 VITALS — BP 138/80 | HR 54 | Temp 98.2°F | Ht 69.0 in | Wt 177.0 lb

## 2020-07-08 DIAGNOSIS — I700 Atherosclerosis of aorta: Secondary | ICD-10-CM | POA: Diagnosis not present

## 2020-07-08 DIAGNOSIS — I350 Nonrheumatic aortic (valve) stenosis: Secondary | ICD-10-CM | POA: Diagnosis not present

## 2020-07-08 DIAGNOSIS — Z8673 Personal history of transient ischemic attack (TIA), and cerebral infarction without residual deficits: Secondary | ICD-10-CM | POA: Diagnosis not present

## 2020-07-08 DIAGNOSIS — D485 Neoplasm of uncertain behavior of skin: Secondary | ICD-10-CM

## 2020-07-08 DIAGNOSIS — I10 Essential (primary) hypertension: Secondary | ICD-10-CM

## 2020-07-08 NOTE — Patient Instructions (Signed)
Consider consulting Dermatology for the lesion on your left leg

## 2020-07-08 NOTE — Progress Notes (Signed)
Date:  07/08/2020   Name:  [name]   DOB:  09/27/1935   MRN:  [id number]   Chief Complaint: Hypertension  Hypertension This is a chronic problem. The problem is unchanged. The problem is resistant. Associated symptoms include peripheral edema. Pertinent negatives include no chest pain, headaches, orthopnea, palpitations or shortness of breath. There are no associated agents to hypertension. Risk factors for coronary artery disease include dyslipidemia. There are no compliance problems.     Lab Results  Component Value Date   CREATININE 0.84 01/07/2020   BUN 13 01/07/2020   NA 141 01/07/2020   K 5.7 (H) 01/07/2020   CL 106 01/07/2020   CO2 24 01/07/2020   Lab Results  Component Value Date   CHOL 115 01/07/2020   HDL 49 01/07/2020   LDLCALC 53 01/07/2020   TRIG 59 01/07/2020   CHOLHDL 2.3 01/07/2020   Lab Results  Component Value Date   TSH 1.520 06/28/2017   No results found for: HGBA1C Lab Results  Component Value Date   WBC 4.3 01/07/2020   HGB 14.3 01/07/2020   HCT 43.1 01/07/2020   MCV 98 (H) 01/07/2020   PLT 164 01/07/2020   Lab Results  Component Value Date   ALT 17 01/07/2020   AST 18 01/07/2020   ALKPHOS 83 01/07/2020   BILITOT 0.4 01/07/2020     Review of Systems  Constitutional: Negative for chills, fatigue, fever and unexpected weight change.  Respiratory: Negative for shortness of breath and wheezing.   Cardiovascular: Positive for leg swelling. Negative for chest pain, palpitations and orthopnea.  Gastrointestinal: Negative for abdominal pain, constipation and diarrhea.  Skin:       Skin lesion on left shin  Neurological: Negative for dizziness and headaches.  Psychiatric/Behavioral: Negative for dysphoric mood and sleep disturbance. The patient is not nervous/anxious.     Patient Active Problem List   Diagnosis Date Noted  . Bilateral carotid artery stenosis 04/28/2020  . Moderate aortic valve stenosis 12/12/2016  . Aortic  atherosclerosis ([place]) 12/12/2016  . Seasonal allergies 07/14/2016  . Hypertension 07/14/2016  . Personal history of prostate cancer 11/23/2014  . Dyslipidemia 09/03/2014  . Edema of extremities 09/03/2014  . History of CVA (cerebrovascular accident) without residual deficits 03/31/2012    No Known Allergies  Past Surgical History:  Procedure Laterality Date  . INGUINAL HERNIA REPAIR Left     Social History   Tobacco Use  . Smoking status: Former Smoker    Packs/day: 0.50    Years: 50.00    Pack years: 25.00    Types: Cigarettes    Quit date: 05/02/2011    Years since quitting: 9.1  . Smokeless tobacco: Never Used  . Tobacco comment: smoking cessation materials not required  Vaping Use  . Vaping Use: Never used  Substance Use Topics  . Alcohol use: No    Alcohol/week: 0.0 standard drinks  . Drug use: No     Medication list has been reviewed and updated.  Current Meds  Medication Sig  . aspirin 81 MG tablet Take 1 tablet by mouth 2 (two) times a week.   Marland Kitchen atorvastatin (LIPITOR) 80 MG tablet Take 1 tablet (80 mg total) by mouth daily.  . clopidogrel (PLAVIX) 75 MG tablet Take 1 tablet (75 mg total) by mouth daily.  Marland Kitchen lisinopril (ZESTRIL) 20 MG tablet Take 1 tablet (20 mg total) by mouth daily.  . vitamin B-12 (CYANOCOBALAMIN) 1000 MCG tablet Take 1,000 mcg by mouth 2 (  two) times a week. Every other day    PHQ 2/9 Scores 07/08/2020 05/05/2020 07/03/2019 03/19/2019  PHQ - 2 Score 0 0 0 0  PHQ- 9 Score 0 - 0 -    GAD 7 : Generalized Anxiety Score 07/08/2020  Nervous, Anxious, on Edge 0  Control/stop worrying 0  Worry too much - different things 0  Trouble relaxing 0  Restless 0  Easily annoyed or irritable 0  Afraid - awful might happen 0  Total GAD 7 Score 0    BP Readings from Last 3 Encounters:  07/08/20 138/80  01/07/20 138/84  07/03/19 124/80    Physical Exam Vitals and nursing note reviewed.  Constitutional:      General: He is not in acute distress.     Appearance: He is well-developed.  HENT:     Head: Normocephalic and atraumatic.  Cardiovascular:     Rate and Rhythm: Normal rate and regular rhythm.  No extrasystoles are present.    Heart sounds: Murmur heard.   Systolic murmur is present with a grade of 3/6.   Pulmonary:     Effort: Pulmonary effort is normal. No respiratory distress.     Breath sounds: No wheezing or rhonchi.  Musculoskeletal:     Cervical back: Normal range of motion and neck supple.     Right lower leg: Edema present.     Left lower leg: Edema present.  Skin:    General: Skin is warm and dry.     Findings: No rash.       Neurological:     General: No focal deficit present.     Mental Status: He is alert and oriented to person, place, and time.  Psychiatric:        Mood and Affect: Mood normal.        Behavior: Behavior normal.     Wt Readings from Last 3 Encounters:  07/08/20 177 lb (80.3 kg)  01/07/20 174 lb (78.9 kg)  07/03/19 179 lb (81.2 kg)    BP 138/80   Pulse (!) 54   Temp 98.2 F (36.8 C) (Oral)   Ht 5\' 9"  (1.753 m)   Wt 177 lb (80.3 kg)   SpO2 97%   BMI 26.14 kg/m   Assessment and Plan: 1. Primary hypertension Clinically stable exam with well controlled BP. Tolerating medications without side effects at this time. Pt to continue current regimen and low sodium diet; benefits of regular exercise as able discussed.  2. Moderate aortic valve stenosis Stable without significant SOB.  No dizziness or syncope. Followed by cardiology. Normal treadmill in 03/2020  3. Aortic atherosclerosis (HCC) On statin therapy LDL < 70  4. History of CVA (cerebrovascular accident) without residual deficits No recurrence Continue aspirin and plavix  5. Neoplasm of uncertain behavior of skin of lower leg Recommend Dermatology evaluation to rule out skin cancer - pt is not interested in pursuing evaluation at this time   Partially dictated using [place]. Any errors are  unintentional.  Halina Maidens, MD [facility]  07/08/2020

## 2020-09-30 ENCOUNTER — Ambulatory Visit: Payer: Medicare HMO | Admitting: Internal Medicine

## 2020-10-12 ENCOUNTER — Other Ambulatory Visit: Payer: Self-pay

## 2020-10-12 ENCOUNTER — Encounter: Payer: Self-pay | Admitting: Internal Medicine

## 2020-10-12 ENCOUNTER — Ambulatory Visit (INDEPENDENT_AMBULATORY_CARE_PROVIDER_SITE_OTHER): Payer: Medicare HMO | Admitting: Internal Medicine

## 2020-10-12 VITALS — BP 134/82 | HR 54 | Temp 98.1°F | Ht 69.0 in | Wt 174.0 lb

## 2020-10-12 DIAGNOSIS — D485 Neoplasm of uncertain behavior of skin: Secondary | ICD-10-CM | POA: Diagnosis not present

## 2020-10-12 DIAGNOSIS — I10 Essential (primary) hypertension: Secondary | ICD-10-CM

## 2020-10-12 DIAGNOSIS — E785 Hyperlipidemia, unspecified: Secondary | ICD-10-CM

## 2020-10-12 DIAGNOSIS — Z8673 Personal history of transient ischemic attack (TIA), and cerebral infarction without residual deficits: Secondary | ICD-10-CM | POA: Diagnosis not present

## 2020-10-12 MED ORDER — ATORVASTATIN CALCIUM 80 MG PO TABS: 80.0000 mg | ORAL_TABLET | Freq: Every day | ORAL | 3 refills | Status: DC

## 2020-10-12 MED ORDER — LISINOPRIL 20 MG PO TABS: 20.0000 mg | ORAL_TABLET | Freq: Every day | ORAL | 3 refills | Status: DC

## 2020-10-12 MED ORDER — CLOPIDOGREL BISULFATE 75 MG PO TABS: 75.0000 mg | ORAL_TABLET | Freq: Every day | ORAL | 3 refills | Status: DC

## 2020-10-12 NOTE — Patient Instructions (Addendum)
Schedule an appointment with Dermatology for the lesion on your leg.  This could be a skin cancer.

## 2020-10-12 NOTE — Progress Notes (Signed)
Date:  10/12/2020   Name:  [name]   DOB:  1936/03/30   MRN:  [id number]   Chief Complaint: Hypertension (137/73 at home)  Hypertension This is a chronic problem. The problem is controlled (at home today 135/77). Pertinent negatives include no chest pain, headaches or palpitations. Past treatments include ACE inhibitors. The current treatment provides significant improvement. There are no compliance problems.    Lab Results  Component Value Date   CREATININE 0.84 01/07/2020   BUN 13 01/07/2020   NA 141 01/07/2020   K 5.7 (H) 01/07/2020   CL 106 01/07/2020   CO2 24 01/07/2020   Lab Results  Component Value Date   CHOL 115 01/07/2020   HDL 49 01/07/2020   LDLCALC 53 01/07/2020   TRIG 59 01/07/2020   CHOLHDL 2.3 01/07/2020   Lab Results  Component Value Date   TSH 1.520 06/28/2017   No results found for: HGBA1C Lab Results  Component Value Date   WBC 4.3 01/07/2020   HGB 14.3 01/07/2020   HCT 43.1 01/07/2020   MCV 98 (H) 01/07/2020   PLT 164 01/07/2020   Lab Results  Component Value Date   ALT 17 01/07/2020   AST 18 01/07/2020   ALKPHOS 83 01/07/2020   BILITOT 0.4 01/07/2020     Review of Systems  Constitutional:  Negative for fatigue and unexpected weight change.  HENT:  Positive for hearing loss. Negative for nosebleeds.   Eyes:  Negative for visual disturbance.  Respiratory:  Negative for cough, chest tightness and wheezing.   Cardiovascular:  Positive for leg swelling. Negative for chest pain and palpitations.  Gastrointestinal:  Negative for abdominal pain, constipation and diarrhea.  Skin:  Positive for wound.  Neurological:  Negative for dizziness, weakness, light-headedness and headaches.  Psychiatric/Behavioral:  Negative for dysphoric mood and sleep disturbance. The patient is not nervous/anxious.    Patient Active Problem List   Diagnosis Date Noted   Bilateral carotid artery stenosis 04/28/2020   Moderate aortic valve stenosis 12/12/2016    Aortic atherosclerosis ([place]) 12/12/2016   Seasonal allergies 07/14/2016   Primary hypertension 07/14/2016   Personal history of prostate cancer 11/23/2014   Dyslipidemia 09/03/2014   Edema of extremities 09/03/2014   History of CVA (cerebrovascular accident) without residual deficits 03/31/2012    No Known Allergies  Past Surgical History:  Procedure Laterality Date   INGUINAL HERNIA REPAIR Left     Social History   Tobacco Use   Smoking status: Former    Packs/day: 0.50    Years: 50.00    Pack years: 25.00    Types: Cigarettes    Quit date: 05/02/2011    Years since quitting: 9.4   Smokeless tobacco: Never   Tobacco comments:    smoking cessation materials not required  Vaping Use   Vaping Use: Never used  Substance Use Topics   Alcohol use: No    Alcohol/week: 0.0 standard drinks   Drug use: No     Medication list has been reviewed and updated.  Current Meds  Medication Sig   aspirin 81 MG tablet Take 1 tablet by mouth 2 (two) times a week.    vitamin B-12 (CYANOCOBALAMIN) 1000 MCG tablet Take 1,000 mcg by mouth 2 (two) times a week. Every other day   [DISCONTINUED] atorvastatin (LIPITOR) 80 MG tablet Take 1 tablet (80 mg total) by mouth daily.   [DISCONTINUED] clopidogrel (PLAVIX) 75 MG tablet Take 1 tablet (75 mg total) by mouth daily.   [  DISCONTINUED] lisinopril (ZESTRIL) 20 MG tablet Take 1 tablet (20 mg total) by mouth daily.    PHQ 2/9 Scores 10/12/2020 07/08/2020 05/05/2020 07/03/2019  PHQ - 2 Score 0 0 0 0  PHQ- 9 Score 0 0 - 0    GAD 7 : Generalized Anxiety Score 10/12/2020 07/08/2020  Nervous, Anxious, on Edge 0 0  Control/stop worrying 0 0  Worry too much - different things 0 0  Trouble relaxing 0 0  Restless 0 0  Easily annoyed or irritable 0 0  Afraid - awful might happen 0 0  Total GAD 7 Score 0 0  Anxiety Difficulty Not difficult at all -    BP Readings from Last 3 Encounters:  10/12/20 134/82  07/08/20 138/80  01/07/20 138/84     Physical Exam Vitals and nursing note reviewed.  Constitutional:      General: He is not in acute distress.    Appearance: He is well-developed.  HENT:     Head: Normocephalic and atraumatic.  Cardiovascular:     Rate and Rhythm: Normal rate and regular rhythm.     Pulses: Normal pulses.     Heart sounds: Murmur heard.  Systolic murmur is present with a grade of 4/6.  Pulmonary:     Effort: Pulmonary effort is normal. No respiratory distress.     Breath sounds: No wheezing or rhonchi.  Musculoskeletal:     Cervical back: Normal range of motion.     Right lower leg: 1+ Pitting Edema present.     Left lower leg: 1+ Pitting Edema present.  Lymphadenopathy:     Cervical: No cervical adenopathy.  Skin:    General: Skin is warm and dry.     Findings: Lesion present. No rash.          Comments: Raised warty non tender 1 cm lesion at the end of a healed scar  Neurological:     Mental Status: He is alert and oriented to person, place, and time.  Psychiatric:        Mood and Affect: Mood normal.        Behavior: Behavior normal.    Wt Readings from Last 3 Encounters:  10/12/20 174 lb (78.9 kg)  07/08/20 177 lb (80.3 kg)  01/07/20 174 lb (78.9 kg)    BP 134/82   Pulse (!) 54   Temp 98.1 F (36.7 C) (Oral)   Ht 5\' 9"  (1.753 m)   Wt 174 lb (78.9 kg)   SpO2 98%   BMI 25.70 kg/m   Assessment and Plan: 1. Essential hypertension Clinically stable exam with well controlled BP. Tolerating medications without side effects at this time. Pt to continue current regimen and low sodium diet; benefits of regular exercise as able discussed. - lisinopril (ZESTRIL) 20 MG tablet; Take 1 tablet (20 mg total) by mouth daily.  Dispense: 90 tablet; Refill: 3  2. Neoplasm of uncertain behavior of skin of lower leg Recommend Dermatology evaluation - pt is very hesitant to go at this time   3. Dyslipidemia - atorvastatin (LIPITOR) 80 MG tablet; Take 1 tablet (80 mg total) by mouth  daily.  Dispense: 90 tablet; Refill: 3  4. History of CVA (cerebrovascular accident) without residual deficits - clopidogrel (PLAVIX) 75 MG tablet; Take 1 tablet (75 mg total) by mouth daily.  Dispense: 90 tablet; Refill: 3   Partially dictated using Editor, commissioning. Any errors are unintentional.  Halina Maidens, MD [facility]  10/12/2020

## 2020-11-30 DIAGNOSIS — I6523 Occlusion and stenosis of bilateral carotid arteries: Secondary | ICD-10-CM | POA: Diagnosis not present

## 2020-11-30 DIAGNOSIS — I350 Nonrheumatic aortic (valve) stenosis: Secondary | ICD-10-CM | POA: Diagnosis not present

## 2020-11-30 DIAGNOSIS — R001 Bradycardia, unspecified: Secondary | ICD-10-CM | POA: Diagnosis not present

## 2020-11-30 DIAGNOSIS — I10 Essential (primary) hypertension: Secondary | ICD-10-CM | POA: Diagnosis not present

## 2021-01-11 ENCOUNTER — Ambulatory Visit (INDEPENDENT_AMBULATORY_CARE_PROVIDER_SITE_OTHER): Payer: Medicare HMO | Admitting: Internal Medicine

## 2021-01-11 ENCOUNTER — Encounter: Payer: Self-pay | Admitting: Internal Medicine

## 2021-01-11 ENCOUNTER — Other Ambulatory Visit: Payer: Self-pay

## 2021-01-11 VITALS — BP 126/88 | HR 57 | Temp 98.0°F | Ht 69.0 in | Wt 172.0 lb

## 2021-01-11 DIAGNOSIS — Z8673 Personal history of transient ischemic attack (TIA), and cerebral infarction without residual deficits: Secondary | ICD-10-CM

## 2021-01-11 DIAGNOSIS — Z8546 Personal history of malignant neoplasm of prostate: Secondary | ICD-10-CM

## 2021-01-11 DIAGNOSIS — Z0000 Encounter for general adult medical examination without abnormal findings: Secondary | ICD-10-CM | POA: Diagnosis not present

## 2021-01-11 DIAGNOSIS — I10 Essential (primary) hypertension: Secondary | ICD-10-CM

## 2021-01-11 DIAGNOSIS — E785 Hyperlipidemia, unspecified: Secondary | ICD-10-CM | POA: Diagnosis not present

## 2021-01-11 DIAGNOSIS — R600 Localized edema: Secondary | ICD-10-CM

## 2021-01-11 LAB — POCT URINALYSIS DIPSTICK
Bilirubin, UA: NEGATIVE
Blood, UA: NEGATIVE
Glucose, UA: NEGATIVE
Ketones, UA: NEGATIVE
Leukocytes, UA: NEGATIVE
Nitrite, UA: NEGATIVE
Protein, UA: NEGATIVE
Spec Grav, UA: >=1.03 — AB (ref 1.010–1.025)
Urobilinogen, UA: 0.2 E.U./dL
pH, UA: 5 (ref 5.0–8.0)

## 2021-01-11 NOTE — Progress Notes (Signed)
Date:  01/11/2021   Name:  [name]   DOB:  Apr 26, 1936   MRN:  [id number]   Chief Complaint: Annual Exam [name] is a 85 y.o. male who presents today for his Complete Annual Exam. He feels fairly well. He reports exercising playing golf. He reports he is sleeping fairly well. He is HOH and refused to pay for hearing aids.  Colonoscopy: aged out  Immunization History  Administered Date(s) Administered   PFIZER(Purple Top)SARS-COV-2 Vaccination 05/26/2019, 06/16/2019    Hypertension This is a chronic problem. The problem is controlled. Pertinent negatives include no chest pain, headaches, palpitations or shortness of breath. Past treatments include ACE inhibitors. The current treatment provides significant improvement. Hypertensive end-organ damage includes CAD/MI and CVA. There is no history of kidney disease.  Hyperlipidemia This is a chronic problem. The problem is controlled. Pertinent negatives include no chest pain or shortness of breath. Current antihyperlipidemic treatment includes statins. The current treatment provides significant improvement of lipids.   Lab Results  Component Value Date   CREATININE 0.84 01/07/2020   BUN 13 01/07/2020   NA 141 01/07/2020   K 5.7 (H) 01/07/2020   CL 106 01/07/2020   CO2 24 01/07/2020   Lab Results  Component Value Date   CHOL 115 01/07/2020   HDL 49 01/07/2020   LDLCALC 53 01/07/2020   TRIG 59 01/07/2020   CHOLHDL 2.3 01/07/2020   Lab Results  Component Value Date   TSH 1.520 06/28/2017   No results found for: HGBA1C Lab Results  Component Value Date   WBC 4.3 01/07/2020   HGB 14.3 01/07/2020   HCT 43.1 01/07/2020   MCV 98 (H) 01/07/2020   PLT 164 01/07/2020   Lab Results  Component Value Date   ALT 17 01/07/2020   AST 18 01/07/2020   ALKPHOS 83 01/07/2020   BILITOT 0.4 01/07/2020     Review of Systems  Constitutional:  Negative for appetite change, chills, fatigue and fever.  HENT:  Positive for  hearing loss. Negative for trouble swallowing.   Respiratory:  Negative for chest tightness, shortness of breath and wheezing.   Cardiovascular:  Positive for leg swelling. Negative for chest pain and palpitations.  Gastrointestinal:  Negative for abdominal pain, blood in stool and constipation.  Genitourinary:  Negative for difficulty urinating.  Musculoskeletal:  Negative for arthralgias, gait problem and joint swelling.  Neurological:  Negative for dizziness and headaches.  Psychiatric/Behavioral:  Negative for dysphoric mood and sleep disturbance. The patient is not nervous/anxious.    Patient Active Problem List   Diagnosis Date Noted   Bilateral carotid artery stenosis 04/28/2020   Moderate aortic valve stenosis 12/12/2016   Aortic atherosclerosis ([place]) 12/12/2016   Seasonal allergies 07/14/2016   Primary hypertension 07/14/2016   Personal history of prostate cancer 11/23/2014   Dyslipidemia 09/03/2014   Edema of extremities 09/03/2014   History of CVA (cerebrovascular accident) without residual deficits 03/31/2012    No Known Allergies  Past Surgical History:  Procedure Laterality Date   INGUINAL HERNIA REPAIR Left     Social History   Tobacco Use   Smoking status: Former    Packs/day: 0.50    Years: 50.00    Pack years: 25.00    Types: Cigarettes    Quit date: 05/02/2011    Years since quitting: 9.7   Smokeless tobacco: Never   Tobacco comments:    smoking cessation materials not required  Vaping Use   Vaping Use: Never used  Substance  Use Topics   Alcohol use: No    Alcohol/week: 0.0 standard drinks   Drug use: No     Medication list has been reviewed and updated.  Current Meds  Medication Sig   atorvastatin (LIPITOR) 80 MG tablet Take 1 tablet (80 mg total) by mouth daily.   clopidogrel (PLAVIX) 75 MG tablet Take 1 tablet (75 mg total) by mouth daily.   lisinopril (ZESTRIL) 20 MG tablet Take 1 tablet (20 mg total) by mouth daily.   vitamin B-12  (CYANOCOBALAMIN) 1000 MCG tablet Take 1,000 mcg by mouth 2 (two) times a week. Every other day    PHQ 2/9 Scores 01/11/2021 10/12/2020 07/08/2020 05/05/2020  PHQ - 2 Score 0 0 0 0  PHQ- 9 Score 3 0 0 -    GAD 7 : Generalized Anxiety Score 01/11/2021 10/12/2020 07/08/2020  Nervous, Anxious, on Edge 0 0 0  Control/stop worrying 0 0 0  Worry too much - different things 0 0 0  Trouble relaxing 0 0 0  Restless 0 0 0  Easily annoyed or irritable 1 0 0  Afraid - awful might happen 0 0 0  Total GAD 7 Score 1 0 0  Anxiety Difficulty - Not difficult at all -    BP Readings from Last 3 Encounters:  01/11/21 126/88  10/12/20 134/82  07/08/20 138/80    Physical Exam Vitals and nursing note reviewed.  Constitutional:      Appearance: Normal appearance. He is well-developed.  HENT:     Head: Normocephalic.     Right Ear: Tympanic membrane, ear canal and external ear normal.     Left Ear: Tympanic membrane, ear canal and external ear normal.     Nose: Nose normal.  Eyes:     Conjunctiva/sclera: Conjunctivae normal.     Pupils: Pupils are equal, round, and reactive to light.  Neck:     Thyroid: No thyromegaly.     Vascular: No carotid bruit.  Cardiovascular:     Rate and Rhythm: Normal rate and regular rhythm. No extrasystoles are present.    Heart sounds: Murmur heard.  Systolic murmur is present with a grade of 4/6.  Pulmonary:     Effort: Pulmonary effort is normal.     Breath sounds: Normal breath sounds. No wheezing.  Chest:  Breasts:    Right: No mass.     Left: No mass.  Abdominal:     General: Bowel sounds are normal.     Palpations: Abdomen is soft.     Tenderness: There is no abdominal tenderness.  Musculoskeletal:        General: Normal range of motion.     Cervical back: Normal range of motion and neck supple.     Right lower leg: 2+ Pitting Edema present.     Left lower leg: 2+ Pitting Edema present.  Lymphadenopathy:     Cervical: No cervical adenopathy.  Skin:     General: Skin is warm and dry.     Capillary Refill: Capillary refill takes less than 2 seconds.  Neurological:     Mental Status: He is alert and oriented to person, place, and time. Mental status is at baseline.     Deep Tendon Reflexes: Reflexes are normal and symmetric.  Psychiatric:        Attention and Perception: Attention normal.        Mood and Affect: Mood normal.        Thought Content: Thought content normal.  Wt Readings from Last 3 Encounters:  01/11/21 172 lb (78 kg)  10/12/20 174 lb (78.9 kg)  07/08/20 177 lb (80.3 kg)    BP 126/88   Pulse (!) 57   Temp 98 F (36.7 C) (Oral)   Ht '5\' 9"'$  (1.753 m)   Wt 172 lb (78 kg)   SpO2 96%   BMI 25.40 kg/m   Assessment and Plan: 1. Annual physical exam Normal exam except for edema He declines Flu and Pneumonia vaccines  2. Primary hypertension Clinically stable exam with well controlled BP. Tolerating medications without side effects at this time. Pt to continue current regimen and low sodium diet; benefits of regular exercise as able discussed. - CBC with Differential/Platelet - POCT urinalysis dipstick  3. Dyslipidemia Tolerating statin medication without side effects at this time LDL is at goal of < 70 on current dose Continue same therapy without change at this time. - Comprehensive metabolic panel - Lipid panel  4. Edema of extremities Moderate but not causing any discomfort He has compression stockings but chooses not to wear them. Continue elevation, sodium restriction  5. Personal history of prostate cancer No current sx Does not follow up with urology - PSA  6. History of CVA (cerebrovascular accident) without residual deficits No residual or evidence of recurrence Continue Statin and Plavix   Partially dictated using Editor, [place]. Any errors are unintentional.  Halina Maidens, MD [facility]  01/11/2021

## 2021-01-12 LAB — COMPREHENSIVE METABOLIC PANEL
ALT: 14 IU/L (ref 0–44)
AST: 18 IU/L (ref 0–40)
Albumin/Globulin Ratio: 2 (ref 1.2–2.2)
Albumin: 3.9 g/dL (ref 3.6–4.6)
Alkaline Phosphatase: 81 IU/L (ref 44–121)
BUN/Creatinine Ratio: 17 (ref 10–24)
BUN: 15 mg/dL (ref 8–27)
Bilirubin Total: 0.4 mg/dL (ref 0.0–1.2)
CO2: 22 mmol/L (ref 20–29)
Calcium: 8.9 mg/dL (ref 8.6–10.2)
Chloride: 107 mmol/L — ABNORMAL HIGH (ref 96–106)
Creatinine, Ser: 0.86 mg/dL (ref 0.76–1.27)
Globulin, Total: 2 g/dL (ref 1.5–4.5)
Glucose: 86 mg/dL (ref 65–99)
Potassium: 4.6 mmol/L (ref 3.5–5.2)
Sodium: 142 mmol/L (ref 134–144)
Total Protein: 5.9 g/dL — ABNORMAL LOW (ref 6.0–8.5)
eGFR: 86 mL/min/{1.73_m2} (ref >59)

## 2021-01-12 LAB — LIPID PANEL
Chol/HDL Ratio: 2.2 ratio (ref 0.0–5.0)
Cholesterol, Total: 110 mg/dL (ref 100–199)
HDL: 51 mg/dL (ref >39)
LDL Chol Calc (NIH): 46 mg/dL (ref 0–99)
Triglycerides: 54 mg/dL (ref 0–149)
VLDL Cholesterol Cal: 13 mg/dL (ref 5–40)

## 2021-01-12 LAB — CBC WITH DIFFERENTIAL/PLATELET
Basophils Absolute: 0 10*3/uL (ref 0.0–0.2)
Basos: 1 %
EOS (ABSOLUTE): 0.1 10*3/uL (ref 0.0–0.4)
Eos: 2 %
Hematocrit: 40.6 % (ref 37.5–51.0)
Hemoglobin: 13.9 g/dL (ref 13.0–17.7)
Immature Grans (Abs): 0 10*3/uL (ref 0.0–0.1)
Immature Granulocytes: 0 %
Lymphocytes Absolute: 1.2 10*3/uL (ref 0.7–3.1)
Lymphs: 29 %
MCH: 32.6 pg (ref 26.6–33.0)
MCHC: 34.2 g/dL (ref 31.5–35.7)
MCV: 95 fL (ref 79–97)
Monocytes Absolute: 0.3 10*3/uL (ref 0.1–0.9)
Monocytes: 8 %
Neutrophils Absolute: 2.6 10*3/uL (ref 1.4–7.0)
Neutrophils: 60 %
Platelets: 150 10*3/uL (ref 150–450)
RBC: 4.26 x10E6/uL (ref 4.14–5.80)
RDW: 12.1 % (ref 11.6–15.4)
WBC: 4.2 10*3/uL (ref 3.4–10.8)

## 2021-01-12 LAB — PSA: Prostate Specific Ag, Serum: <0.1 ng/mL (ref 0.0–4.0)

## 2021-04-04 ENCOUNTER — Ambulatory Visit: Payer: Medicare HMO | Admitting: Dermatology

## 2021-05-09 ENCOUNTER — Ambulatory Visit (INDEPENDENT_AMBULATORY_CARE_PROVIDER_SITE_OTHER): Payer: Medicare HMO

## 2021-05-09 DIAGNOSIS — Z0000 Encounter for general adult medical examination without abnormal findings: Secondary | ICD-10-CM

## 2021-05-09 NOTE — Progress Notes (Signed)
Subjective:   [name] is a 86 y.o. male who presents for Medicare Annual/Subsequent preventive examination.  Virtual Visit via Telephone Note  I connected with  [name] on 05/09/21 at  9:20 AM EST by telephone and verified that I am speaking with the correct person using two identifiers.  Location: Patient: home Provider: [facility] Persons participating in the virtual visit: patient & wife [name] Health Advisor   I discussed the limitations, risks, security and privacy concerns of performing an evaluation and management service by telephone and the availability of in person appointments. The patient expressed understanding and agreed to proceed.  Interactive audio and video telecommunications were attempted between this nurse and patient, however failed, due to patient having technical difficulties OR patient did not have access to video capability.  We continued and completed visit with audio only.  Some vital signs may be absent or patient reported.   Jimmy Marker, LPN   Review of Systems     Cardiac Risk Factors include: advanced age (>71men, >63 women);dyslipidemia;hypertension;male gender     Objective:    There were no vitals filed for this visit. There is no height or weight on file to calculate BMI.  Advanced Directives 05/09/2021 05/05/2020 03/19/2019 05/30/2018 01/02/2018 05/31/2017 02/12/2017  Does Patient Have a Medical Advance Directive? Yes Yes Yes No Yes No Yes  Type of Paramedic of [place];Living will [place];Living will [place];Living will - [place];Living will - [place];Living will  Does patient want to make changes to medical advance directive? - - - - - - -  Copy of [place] in Chart? Yes - validated most recent copy scanned in chart (See row information) Yes - validated most recent copy scanned in chart (See row information)  Yes - validated most recent copy scanned in chart (See row information) - Yes - No - copy requested  Would patient like information on creating a medical advance directive? - - - No - Patient declined - - -    Current Medications (verified) Outpatient Encounter Medications as of 05/09/2021  Medication Sig   atorvastatin (LIPITOR) 80 MG tablet Take 1 tablet (80 mg total) by mouth daily.   clopidogrel (PLAVIX) 75 MG tablet Take 1 tablet (75 mg total) by mouth daily.   lisinopril (ZESTRIL) 20 MG tablet Take 1 tablet (20 mg total) by mouth daily.   vitamin B-12 (CYANOCOBALAMIN) 1000 MCG tablet Take 1,000 mcg by mouth 2 (two) times a week.   No facility-administered encounter medications on file as of 05/09/2021.    Allergies (verified) Patient has no known allergies.   History: Past Medical History:  Diagnosis Date   Edema extremities    Elevated PSA    HLD (hyperlipidemia)    Hypertension    Kidney stones    Prostate cancer ([place])    Stroke [facility])    Past Surgical History:  Procedure Laterality Date   INGUINAL HERNIA REPAIR Left    Family History  Problem Relation Age of Onset   Parkinson's disease Mother    Narcolepsy Mother    Stroke Brother    Prostate cancer Brother    Congestive Heart Failure Brother    Heart disease Brother    Alzheimer's disease Daughter    Kidney disease Neg Hx    Kidney cancer Neg Hx    Bladder Cancer Neg Hx    Social History   Socioeconomic History  Marital status: Married    Spouse name: Not on file   Number of children: 2   Years of education: Not on file   Highest education level: GED or equivalent  Occupational History   Occupation: Retired  Tobacco Use   Smoking status: Former    Packs/day: 0.50    Years: 50.00    Pack years: 25.00    Types: Cigarettes    Quit date: 05/02/2011    Years since quitting: 10.0   Smokeless tobacco: Never   Tobacco comments:    smoking cessation materials not required  Vaping Use   Vaping Use: Never  used  Substance and Sexual Activity   Alcohol use: No   Drug use: No   Sexual activity: Not Currently    Birth control/protection: None  Other Topics Concern   Not on file  Social History Narrative   Not on file   Social Determinants of Health   Financial Resource Strain: Low Risk    Difficulty of Paying Living Expenses: Not hard at all  Food Insecurity: No Food Insecurity   Worried About Charity fundraiser in the Last Year: Never true   [place] in the Last Year: Never true  Transportation Needs: No Transportation Needs   Lack of Transportation (Medical): No   Lack of Transportation (Non-Medical): No  Physical Activity: Inactive   Days of Exercise per Week: 0 days   Minutes of Exercise per Session: 0 min  Stress: No Stress Concern Present   Feeling of Stress : Not at all  Social Connections: Moderately Integrated   Frequency of Communication with Friends and Family: More than three times a week   Frequency of Social Gatherings with Friends and Family: Three times a week   Attends Religious Services: More than 4 times per year   Active Member of Clubs or Organizations: No   Attends Archivist Meetings: Never   Marital Status: Married    Tobacco Counseling Counseling given: Not Answered Tobacco comments: smoking cessation materials not required   Clinical Intake:  Pre-visit preparation completed: Yes  Pain : No/denies pain     Nutritional Risks: None Diabetes: No  How often do you need to have someone help you when you read instructions, pamphlets, or other written materials from your doctor or pharmacy?: 1 - Never    Interpreter Needed?: No  Information entered by :: Jimmy Marker LPN   Activities of Daily Living In your present state of health, do you have any difficulty performing the following activities: 05/09/2021 01/11/2021  Hearing? Y Y  Comment declines hearing aids -  Vision? N N  Difficulty concentrating or making decisions? N N   Walking or climbing stairs? Y Y  Dressing or bathing? N N  Doing errands, shopping? N Y  Conservation officer, nature and eating ? N -  Using the Toilet? N -  In the past six months, have you accidently leaked urine? N -  Do you have problems with loss of bowel control? N -  Managing your Medications? N -  Managing your Finances? N -  Housekeeping or managing your Housekeeping? N -  Some recent data might be hidden    Patient Care Team: Glean Hess, MD as PCP - General (Internal Medicine) Corey Skains, MD as Consulting Physician (Cardiology)  Indicate any recent Medical Services you may have received from other than [facility] providers in the past year (date may be approximate).     Assessment:  This is a routine wellness examination for [name].  Hearing/Vision screen Hearing Screening - Comments:: Pt c/o mild hearing difficulty, declines hearing aids  Vision Screening - Comments:: Past due for eye exam. Declines referral for eye exam. Only wears reading glasses  Dietary issues and exercise activities discussed: Current Exercise Habits: The patient does not participate in regular exercise at present, Exercise limited by: None identified   Goals Addressed             This Visit's Progress    DIET - INCREASE WATER INTAKE   Not on track    Recommend to drink at least 6-8 8oz glasses of water per day.     Prevent Falls   On track    Recommend to remove items from your home that may cause you to trip or slip       Depression Screen PHQ 2/9 Scores 05/09/2021 01/11/2021 10/12/2020 07/08/2020 05/05/2020 07/03/2019 03/19/2019  PHQ - 2 Score 0 0 0 0 0 0 0  PHQ- 9 Score - 3 0 0 - 0 -    Fall Risk Fall Risk  05/09/2021 01/11/2021 10/12/2020 07/08/2020 05/05/2020  Falls in the past year? 0 0 0 0 0  Comment - - - - -  Number falls in past yr: 0 0 - - 0  Comment - - - - -  Injury with Fall? 0 0 - - 0  Comment - - - - -  Risk for fall due to : No Fall Risks No Fall Risks - - No Fall Risks   Risk for fall due to: Comment - - - - -  Follow up Falls prevention discussed Falls evaluation completed Falls evaluation completed Falls evaluation completed Falls prevention discussed    FALL RISK PREVENTION PERTAINING TO THE HOME:  Any stairs in or around the home? Yes  If so, are there any without handrails? No  Home free of loose throw rugs in walkways, pet beds, electrical cords, etc? Yes  Adequate lighting in your home to reduce risk of falls? Yes   ASSISTIVE DEVICES UTILIZED TO PREVENT FALLS:  Life alert? No  Use of a cane, walker or w/c? No  Grab bars in the bathroom? Yes  Shower chair or bench in shower? No  Elevated toilet seat or a handicapped toilet? No   TIMED UP AND GO:  Was the test performed? No .   Cognitive Function: Normal cognitive status assessed by direct observation by this Nurse Health Advisor. No abnormalities found.       6CIT Screen 01/02/2019 01/02/2018  What Year? 0 points 0 points  What month? 0 points 0 points  What time? 0 points 0 points  Count back from 20 0 points 0 points  Months in reverse 0 points 0 points  Repeat phrase 0 points 2 points  Total Score 0 2    Immunizations Immunization History  Administered Date(s) Administered   PFIZER(Purple Top)SARS-COV-2 Vaccination 05/26/2019, 06/16/2019    TDAP status: Due, Education has been provided regarding the importance of this vaccine. Advised may receive this vaccine at local pharmacy or Health Dept. Aware to provide a copy of the vaccination record if obtained from local pharmacy or Health Dept. Verbalized acceptance and understanding.  Flu Vaccine status: Declined, Education has been provided regarding the importance of this vaccine but patient still declined. Advised may receive this vaccine at local pharmacy or Health Dept. Aware to provide a copy of the vaccination record if obtained from local pharmacy or Health  Dept. Verbalized acceptance and understanding.  Pneumococcal vaccine  status: Declined,  Education has been provided regarding the importance of this vaccine but patient still declined. Advised may receive this vaccine at local pharmacy or Health Dept. Aware to provide a copy of the vaccination record if obtained from local pharmacy or Health Dept. Verbalized acceptance and understanding.   Covid-19 vaccine status: Completed vaccines  Qualifies for Shingles Vaccine? Yes   Zostavax completed No   Shingrix Completed?: No.    Education has been provided regarding the importance of this vaccine. Patient has been advised to call insurance company to determine out of pocket expense if they have not yet received this vaccine. Advised may also receive vaccine at local pharmacy or Health Dept. Verbalized acceptance and understanding.  Screening Tests Health Maintenance  Topic Date Due   TETANUS/TDAP  Never done   COVID-19 Vaccine (3 - Pfizer risk series) 07/14/2019   INFLUENZA VACCINE  07/29/2021 (Originally 11/29/2020)   Zoster Vaccines- Shingrix (1 of 2) 08/07/2021 (Originally 05/27/1954)   Pneumonia Vaccine 50+ Years old (1 - PCV) 05/09/2022 (Originally 05/28/1941)   HPV [place] Maintenance Due  Topic Date Due   TETANUS/TDAP  Never done   COVID-19 Vaccine (3 - Pfizer risk series) 07/14/2019    Colorectal cancer screening: No longer required.   Lung Cancer Screening: (Low Dose CT Chest recommended if Age 38-80 years, 30 pack-year currently smoking OR have quit w/in 15years.) does not qualify.    Additional Screening:  Hepatitis C Screening: does not qualify  Vision Screening: Recommended annual ophthalmology exams for early detection of glaucoma and other disorders of the eye. Is the patient up to date with their annual eye exam?  No  Who is the provider or what is the name of the office in which the patient attends annual eye exams? Not established  If pt is not established with a provider, would they like to be  referred to a provider to establish care? No .   Dental Screening: Recommended annual dental exams for proper oral hygiene  Community Resource Referral / Chronic Care Management: CRR required this visit?  No   CCM required this visit?  No      Plan:     I have personally reviewed and noted the following in the patients chart:   Medical and social history Use of alcohol, tobacco or illicit drugs  Current medications and supplements including opioid prescriptions. Patient is not currently taking opioid prescriptions. Functional ability and status Nutritional status Physical activity Advanced directives List of other physicians Hospitalizations, surgeries, and ER visits in previous 12 months Vitals Screenings to include cognitive, depression, and falls Referrals and appointments  In addition, I have reviewed and discussed with patient certain preventive protocols, quality metrics, and best practice recommendations. A written personalized care plan for preventive services as well as general preventive health recommendations were provided to patient.   Due to this being a telephonic visit, the after visit summary with patients personalized plan was offered to patient via [place].   Jimmy Marker, LPN   [birth year]   Nurse Notes: none

## 2021-05-09 NOTE — Patient Instructions (Signed)
[name] , Thank you for taking time to come for your Medicare Wellness Visit. I appreciate your ongoing commitment to your health goals. Please review the following plan we discussed and let me know if I can assist you in the future.   Screening recommendations/referrals: Colonoscopy: no longer required Recommended yearly ophthalmology/optometry visit for glaucoma screening and checkup Recommended yearly dental visit for hygiene and checkup  Vaccinations: Influenza vaccine: declined Pneumococcal vaccine: declined Tdap vaccine: due Shingles vaccine: Shingrix discussed. Please contact your pharmacy for coverage information.  Covid-19: done 05/26/19 & 06/16/19  Conditions/risks identified: Keep up the great work!  Next appointment: Follow up in one year for your annual wellness visit.   Preventive Care 100 Years and Older, Male Preventive care refers to lifestyle choices and visits with your health care provider that can promote health and wellness. What does preventive care include? A yearly physical exam. This is also called an annual well check. Dental exams once or twice a year. Routine eye exams. Ask your health care provider how often you should have your eyes checked. Personal lifestyle choices, including: Daily care of your teeth and gums. Regular physical activity. Eating a healthy diet. Avoiding tobacco and drug use. Limiting alcohol use. Practicing safe sex. Taking low doses of aspirin every day. Taking vitamin and mineral supplements as recommended by your health care provider. What happens during an annual well check? The services and screenings done by your health care provider during your annual well check will depend on your age, overall health, lifestyle risk factors, and family history of disease. Counseling  Your health care provider may ask you questions about your: Alcohol use. Tobacco use. Drug use. Emotional well-being. Home and relationship  well-being. Sexual activity. Eating habits. History of falls. Memory and ability to understand (cognition). Work and work Statistician. Screening  You may have the following tests or measurements: Height, weight, and BMI. Blood pressure. Lipid and cholesterol levels. These may be checked every 5 years, or more frequently if you are over 32 years old. Skin check. Lung cancer screening. You may have this screening every year starting at age 15 if you have a 30-pack-year history of smoking and currently smoke or have quit within the past 15 years. Fecal occult blood test (FOBT) of the stool. You may have this test every year starting at age 40. Flexible sigmoidoscopy or colonoscopy. You may have a sigmoidoscopy every 5 years or a colonoscopy every 10 years starting at age 60. Prostate cancer screening. Recommendations will vary depending on your family history and other risks. Hepatitis C blood test. Hepatitis B blood test. Sexually transmitted disease (STD) testing. Diabetes screening. This is done by checking your blood sugar (glucose) after you have not eaten for a while (fasting). You may have this done every 1-3 years. Abdominal aortic aneurysm (AAA) screening. You may need this if you are a current or former smoker. Osteoporosis. You may be screened starting at age 75 if you are at high risk. Talk with your health care provider about your test results, treatment options, and if necessary, the need for more tests. Vaccines  Your health care provider may recommend certain vaccines, such as: Influenza vaccine. This is recommended every year. Tetanus, diphtheria, and acellular pertussis (Tdap, Td) vaccine. You may need a Td booster every 10 years. Zoster vaccine. You may need this after age 40. Pneumococcal 13-valent conjugate (PCV13) vaccine. One dose is recommended after age 75. Pneumococcal polysaccharide (PPSV23) vaccine. One dose is recommended after age 50.  Talk to your health care  provider about which screenings and vaccines you need and how often you need them. This information is not intended to replace advice given to you by your health care provider. Make sure you discuss any questions you have with your health care provider. Document Released: 05/14/2015 Document Revised: 01/05/2016 Document Reviewed: 02/16/2015 Elsevier Interactive Patient Education  2017 [place] Prevention in the Home Falls can cause injuries. They can happen to people of all ages. There are many things you can do to make your home safe and to help prevent falls. What can I do on the outside of my home? Regularly fix the edges of walkways and driveways and fix any cracks. Remove anything that might make you trip as you walk through a door, such as a raised step or threshold. Trim any bushes or trees on the path to your home. Use bright outdoor lighting. Clear any walking paths of anything that might make someone trip, such as rocks or tools. Regularly check to see if handrails are loose or broken. Make sure that both sides of any steps have handrails. Any raised decks and porches should have guardrails on the edges. Have any leaves, snow, or ice cleared regularly. Use sand or salt on walking paths during winter. Clean up any spills in your garage right away. This includes oil or grease spills. What can I do in the bathroom? Use night lights. Install grab bars by the toilet and in the tub and shower. Do not use towel bars as grab bars. Use non-skid mats or decals in the tub or shower. If you need to sit down in the shower, use a plastic, non-slip stool. Keep the floor dry. Clean up any water that spills on the floor as soon as it happens. Remove soap buildup in the tub or shower regularly. Attach bath mats securely with double-sided non-slip rug tape. Do not have throw rugs and other things on the floor that can make you trip. What can I do in the bedroom? Use night lights. Make  sure that you have a light by your bed that is easy to reach. Do not use any sheets or blankets that are too big for your bed. They should not hang down onto the floor. Have a firm chair that has side arms. You can use this for support while you get dressed. Do not have throw rugs and other things on the floor that can make you trip. What can I do in the kitchen? Clean up any spills right away. Avoid walking on wet floors. Keep items that you use a lot in easy-to-reach places. If you need to reach something above you, use a strong step stool that has a grab bar. Keep electrical cords out of the way. Do not use floor polish or wax that makes floors slippery. If you must use wax, use non-skid floor wax. Do not have throw rugs and other things on the floor that can make you trip. What can I do with my stairs? Do not leave any items on the stairs. Make sure that there are handrails on both sides of the stairs and use them. Fix handrails that are broken or loose. Make sure that handrails are as long as the stairways. Check any carpeting to make sure that it is firmly attached to the stairs. Fix any carpet that is loose or worn. Avoid having throw rugs at the top or bottom of the stairs. If you do have throw  rugs, attach them to the floor with carpet tape. Make sure that you have a light switch at the top of the stairs and the bottom of the stairs. If you do not have them, ask someone to add them for you. What else can I do to help prevent falls? Wear shoes that: Do not have high heels. Have rubber bottoms. Are comfortable and fit you well. Are closed at the toe. Do not wear sandals. If you use a stepladder: Make sure that it is fully opened. Do not climb a closed stepladder. Make sure that both sides of the stepladder are locked into place. Ask someone to hold it for you, if possible. Clearly mark and make sure that you can see: Any grab bars or handrails. First and last steps. Where the  edge of each step is. Use tools that help you move around (mobility aids) if they are needed. These include: Canes. Walkers. Scooters. Crutches. Turn on the lights when you go into a dark area. Replace any light bulbs as soon as they burn out. Set up your furniture so you have a clear path. Avoid moving your furniture around. If any of your floors are uneven, fix them. If there are any pets around you, be aware of where they are. Review your medicines with your doctor. Some medicines can make you feel dizzy. This can increase your chance of falling. Ask your doctor what other things that you can do to help prevent falls. This information is not intended to replace advice given to you by your health care provider. Make sure you discuss any questions you have with your health care provider. Document Released: 02/11/2009 Document Revised: 09/23/2015 Document Reviewed: 05/22/2014 Elsevier Interactive Patient Education  2017 [employer].

## 2021-06-13 DIAGNOSIS — Y9301 Activity, walking, marching and hiking: Secondary | ICD-10-CM | POA: Diagnosis not present

## 2021-06-13 DIAGNOSIS — S066X0A Traumatic subarachnoid hemorrhage without loss of consciousness, initial encounter: Secondary | ICD-10-CM | POA: Diagnosis not present

## 2021-06-13 DIAGNOSIS — M47812 Spondylosis without myelopathy or radiculopathy, cervical region: Secondary | ICD-10-CM | POA: Diagnosis not present

## 2021-06-13 DIAGNOSIS — S61411A Laceration without foreign body of right hand, initial encounter: Secondary | ICD-10-CM | POA: Diagnosis not present

## 2021-06-13 DIAGNOSIS — S8001XA Contusion of right knee, initial encounter: Secondary | ICD-10-CM | POA: Diagnosis not present

## 2021-06-13 DIAGNOSIS — Z7902 Long term (current) use of antithrombotics/antiplatelets: Secondary | ICD-10-CM | POA: Diagnosis not present

## 2021-06-13 DIAGNOSIS — I609 Nontraumatic subarachnoid hemorrhage, unspecified: Secondary | ICD-10-CM | POA: Diagnosis not present

## 2021-06-13 DIAGNOSIS — S12001A Unspecified nondisplaced fracture of first cervical vertebra, initial encounter for closed fracture: Secondary | ICD-10-CM | POA: Diagnosis not present

## 2021-06-13 DIAGNOSIS — S00531A Contusion of lip, initial encounter: Secondary | ICD-10-CM | POA: Diagnosis not present

## 2021-06-13 DIAGNOSIS — I10 Essential (primary) hypertension: Secondary | ICD-10-CM | POA: Diagnosis not present

## 2021-06-13 DIAGNOSIS — S0181XA Laceration without foreign body of other part of head, initial encounter: Secondary | ICD-10-CM | POA: Diagnosis not present

## 2021-06-13 DIAGNOSIS — Y92480 Sidewalk as the place of occurrence of the external cause: Secondary | ICD-10-CM | POA: Diagnosis not present

## 2021-06-13 DIAGNOSIS — S12000A Unspecified displaced fracture of first cervical vertebra, initial encounter for closed fracture: Secondary | ICD-10-CM | POA: Diagnosis not present

## 2021-06-13 DIAGNOSIS — E785 Hyperlipidemia, unspecified: Secondary | ICD-10-CM | POA: Diagnosis not present

## 2021-06-13 DIAGNOSIS — S0993XA Unspecified injury of face, initial encounter: Secondary | ICD-10-CM | POA: Diagnosis not present

## 2021-06-13 DIAGNOSIS — Z20822 Contact with and (suspected) exposure to covid-19: Secondary | ICD-10-CM | POA: Diagnosis not present

## 2021-06-13 DIAGNOSIS — Z79899 Other long term (current) drug therapy: Secondary | ICD-10-CM | POA: Diagnosis not present

## 2021-06-13 DIAGNOSIS — S12091A Other nondisplaced fracture of first cervical vertebra, initial encounter for closed fracture: Secondary | ICD-10-CM | POA: Diagnosis not present

## 2021-06-13 DIAGNOSIS — S0083XA Contusion of other part of head, initial encounter: Secondary | ICD-10-CM | POA: Diagnosis not present

## 2021-06-13 DIAGNOSIS — S065X0A Traumatic subdural hemorrhage without loss of consciousness, initial encounter: Secondary | ICD-10-CM | POA: Diagnosis not present

## 2021-06-13 DIAGNOSIS — S066XAA Traumatic subarachnoid hemorrhage with loss of consciousness status unknown, initial encounter: Secondary | ICD-10-CM | POA: Diagnosis not present

## 2021-06-13 DIAGNOSIS — Z23 Encounter for immunization: Secondary | ICD-10-CM | POA: Diagnosis not present

## 2021-06-13 DIAGNOSIS — S0081XA Abrasion of other part of head, initial encounter: Secondary | ICD-10-CM | POA: Diagnosis not present

## 2021-06-13 DIAGNOSIS — W1830XA Fall on same level, unspecified, initial encounter: Secondary | ICD-10-CM | POA: Diagnosis not present

## 2021-06-13 DIAGNOSIS — R600 Localized edema: Secondary | ICD-10-CM | POA: Diagnosis not present

## 2021-06-13 DIAGNOSIS — S0031XA Abrasion of nose, initial encounter: Secondary | ICD-10-CM | POA: Diagnosis not present

## 2021-06-13 DIAGNOSIS — W101XXA Fall (on)(from) sidewalk curb, initial encounter: Secondary | ICD-10-CM | POA: Insufficient documentation

## 2021-06-13 DIAGNOSIS — S0001XA Abrasion of scalp, initial encounter: Secondary | ICD-10-CM | POA: Diagnosis not present

## 2021-06-14 DIAGNOSIS — S12000A Unspecified displaced fracture of first cervical vertebra, initial encounter for closed fracture: Secondary | ICD-10-CM

## 2021-06-14 DIAGNOSIS — Z8673 Personal history of transient ischemic attack (TIA), and cerebral infarction without residual deficits: Secondary | ICD-10-CM | POA: Diagnosis not present

## 2021-06-14 DIAGNOSIS — M5030 Other cervical disc degeneration, unspecified cervical region: Secondary | ICD-10-CM | POA: Diagnosis not present

## 2021-06-14 DIAGNOSIS — S066XAA Traumatic subarachnoid hemorrhage with loss of consciousness status unknown, initial encounter: Secondary | ICD-10-CM | POA: Insufficient documentation

## 2021-06-14 DIAGNOSIS — I10 Essential (primary) hypertension: Secondary | ICD-10-CM | POA: Diagnosis not present

## 2021-06-14 DIAGNOSIS — S12090A Other displaced fracture of first cervical vertebra, initial encounter for closed fracture: Secondary | ICD-10-CM | POA: Diagnosis not present

## 2021-06-14 DIAGNOSIS — I350 Nonrheumatic aortic (valve) stenosis: Secondary | ICD-10-CM | POA: Diagnosis not present

## 2021-06-14 DIAGNOSIS — S12091A Other nondisplaced fracture of first cervical vertebra, initial encounter for closed fracture: Secondary | ICD-10-CM | POA: Diagnosis not present

## 2021-06-14 DIAGNOSIS — I609 Nontraumatic subarachnoid hemorrhage, unspecified: Secondary | ICD-10-CM | POA: Diagnosis not present

## 2021-06-14 HISTORY — DX: Traumatic subarachnoid hemorrhage with loss of consciousness status unknown, initial encounter: S06.6XAA

## 2021-06-14 HISTORY — DX: Unspecified displaced fracture of first cervical vertebra, initial encounter for closed fracture: S12.000A

## 2021-07-27 DIAGNOSIS — S12000D Unspecified displaced fracture of first cervical vertebra, subsequent encounter for fracture with routine healing: Secondary | ICD-10-CM | POA: Diagnosis not present

## 2021-07-27 DIAGNOSIS — I10 Essential (primary) hypertension: Secondary | ICD-10-CM | POA: Diagnosis not present

## 2021-07-27 DIAGNOSIS — W1830XD Fall on same level, unspecified, subsequent encounter: Secondary | ICD-10-CM | POA: Diagnosis not present

## 2021-07-27 DIAGNOSIS — S12001D Unspecified nondisplaced fracture of first cervical vertebra, subsequent encounter for fracture with routine healing: Secondary | ICD-10-CM | POA: Diagnosis not present

## 2021-07-27 DIAGNOSIS — S066X0D Traumatic subarachnoid hemorrhage without loss of consciousness, subsequent encounter: Secondary | ICD-10-CM | POA: Diagnosis not present

## 2021-07-27 DIAGNOSIS — Z7902 Long term (current) use of antithrombotics/antiplatelets: Secondary | ICD-10-CM | POA: Diagnosis not present

## 2021-07-27 DIAGNOSIS — E785 Hyperlipidemia, unspecified: Secondary | ICD-10-CM | POA: Diagnosis not present

## 2021-07-27 DIAGNOSIS — I350 Nonrheumatic aortic (valve) stenosis: Secondary | ICD-10-CM | POA: Diagnosis not present

## 2021-07-27 DIAGNOSIS — S12000A Unspecified displaced fracture of first cervical vertebra, initial encounter for closed fracture: Secondary | ICD-10-CM | POA: Diagnosis not present

## 2021-09-08 DIAGNOSIS — S12090D Other displaced fracture of first cervical vertebra, subsequent encounter for fracture with routine healing: Secondary | ICD-10-CM | POA: Diagnosis not present

## 2021-09-08 DIAGNOSIS — S12001D Unspecified nondisplaced fracture of first cervical vertebra, subsequent encounter for fracture with routine healing: Secondary | ICD-10-CM | POA: Diagnosis not present

## 2021-09-08 DIAGNOSIS — M4312 Spondylolisthesis, cervical region: Secondary | ICD-10-CM | POA: Diagnosis not present

## 2021-09-08 DIAGNOSIS — X58XXXD Exposure to other specified factors, subsequent encounter: Secondary | ICD-10-CM | POA: Diagnosis not present

## 2021-09-08 DIAGNOSIS — S12000A Unspecified displaced fracture of first cervical vertebra, initial encounter for closed fracture: Secondary | ICD-10-CM | POA: Diagnosis not present

## 2021-09-08 DIAGNOSIS — M47812 Spondylosis without myelopathy or radiculopathy, cervical region: Secondary | ICD-10-CM | POA: Diagnosis not present

## 2021-09-08 DIAGNOSIS — Z7902 Long term (current) use of antithrombotics/antiplatelets: Secondary | ICD-10-CM | POA: Diagnosis not present

## 2021-09-08 DIAGNOSIS — M5021 Other cervical disc displacement,  high cervical region: Secondary | ICD-10-CM | POA: Diagnosis not present

## 2021-09-08 DIAGNOSIS — I10 Essential (primary) hypertension: Secondary | ICD-10-CM | POA: Diagnosis not present

## 2021-09-24 ENCOUNTER — Other Ambulatory Visit: Payer: Self-pay | Admitting: Internal Medicine

## 2021-09-24 DIAGNOSIS — E785 Hyperlipidemia, unspecified: Secondary | ICD-10-CM

## 2021-09-24 DIAGNOSIS — Z8673 Personal history of transient ischemic attack (TIA), and cerebral infarction without residual deficits: Secondary | ICD-10-CM

## 2021-09-24 DIAGNOSIS — I10 Essential (primary) hypertension: Secondary | ICD-10-CM

## 2021-09-27 NOTE — Telephone Encounter (Signed)
Requested medication (s) are due for refill today: yes  Requested medication (s) are on the active medication list: yes  Last refill:  10/12/20 #90/3  Future visit scheduled: yes  Notes to clinic:  Unable to refill per protocol due to failed labs, no updated results.      Requested Prescriptions  Pending Prescriptions Disp Refills   clopidogrel (PLAVIX) 75 MG tablet [Pharmacy Med Name: CLOPIDOGREL  TAB '75MG'$ ] 90 tablet 3    Sig: TAKE 1 TABLET DAILY     Hematology: Antiplatelets - clopidogrel Failed - 09/24/2021  2:16 AM      Failed - HCT in normal range and within 180 days    Hematocrit  Date Value Ref Range Status  01/11/2021 40.6 37.5 - 51.0 % Final         Failed - HGB in normal range and within 180 days    Hemoglobin  Date Value Ref Range Status  01/11/2021 13.9 13.0 - 17.7 g/dL Final         Failed - PLT in normal range and within 180 days    Platelets  Date Value Ref Range Status  01/11/2021 150 150 - 450 x10E3/uL Final         Failed - Valid encounter within last 6 months    Recent Outpatient Visits           8 months ago Annual physical exam   [facility] Glean Hess, MD   11 months ago Essential hypertension   [facility] Glean Hess, MD   1 year ago Primary hypertension   [facility] Glean Hess, MD   1 year ago Annual physical exam   [facility] Glean Hess, MD   2 years ago Essential hypertension   [place], [place]       Future Appointments             In 3 months Glean Hess, MD [facility] [place] in normal range and within 360 days    Creatinine  Date Value Ref Range Status  04/25/2012 0.81 0.60 - 1.30 mg/dL Final   Creatinine, Ser  Date Value Ref Range Status  01/11/2021 0.86 0.76 - 1.27 mg/dL Final          lisinopril (ZESTRIL) 20 MG tablet [Pharmacy Med Name: LISINOPRIL TAB '20MG'$ ] 90 tablet 3    Sig:  TAKE 1 TABLET DAILY     Cardiovascular:  ACE Inhibitors Failed - 09/24/2021  2:16 AM      Failed - Cr in normal range and within 180 days    Creatinine  Date Value Ref Range Status  04/25/2012 0.81 0.60 - 1.30 mg/dL Final   Creatinine, Ser  Date Value Ref Range Status  01/11/2021 0.86 0.76 - 1.27 mg/dL Final         Failed - K in normal range and within 180 days    Potassium  Date Value Ref Range Status  01/11/2021 4.6 3.5 - 5.2 mmol/L Final  04/25/2012 4.4 3.5 - 5.1 mmol/L Final         Failed - Valid encounter within last 6 months    Recent Outpatient Visits           8 months ago Annual physical exam   [place], MD   11 months ago Essential hypertension  [facility] Glean Hess, MD   1 year ago Primary hypertension   [facility] Glean Hess, MD   1 year ago Annual physical exam   [facility] Glean Hess, MD   2 years ago Essential hypertension   [facility] Glean Hess, MD       Future Appointments             In 3 months Glean Hess, MD [facility] [place] - Patient is not pregnant      Passed - Last BP in normal range    BP Readings from Last 1 Encounters:  01/11/21 126/88          atorvastatin (LIPITOR) 80 MG tablet [Pharmacy Med Name: ATORVASTATIN TAB '80MG'$ ] 90 tablet 3    Sig: TAKE 1 TABLET DAILY     Cardiovascular:  Antilipid - Statins Failed - 09/24/2021  2:16 AM      Failed - Lipid Panel in normal range within the last 12 months    Cholesterol, Total  Date Value Ref Range Status  01/11/2021 110 100 - 199 mg/dL Final   Cholesterol  Date Value Ref Range Status  04/25/2012 162 0 - 200 mg/dL Final   Ldl Cholesterol, Calc  Date Value Ref Range Status  04/25/2012 113 (H) 0 - 100 mg/dL Final   LDL Chol Calc (NIH)  Date Value Ref Range Status  01/11/2021 46 0 - 99 mg/dL Final   HDL Cholesterol  Date Value Ref  Range Status  04/25/2012 30 (L) 40 - 60 mg/dL Final   HDL  Date Value Ref Range Status  01/11/2021 51 >39 mg/dL Final   Triglycerides  Date Value Ref Range Status  01/11/2021 54 0 - 149 mg/dL Final  04/25/2012 95 0 - 200 mg/dL Final         Passed - Patient is not pregnant      Passed - Valid encounter within last 12 months    Recent Outpatient Visits           8 months ago Annual physical exam   [facility] Glean Hess, MD   11 months ago Essential hypertension   [place], Laura H, MD   1 year ago Primary hypertension   [facility] Glean Hess, MD   1 year ago Annual physical exam   [facility] Glean Hess, MD   2 years ago Essential hypertension   [facility] Glean Hess, MD       Future Appointments             In 3 months [place] Melia Jesse Sans, MD [facility], [facility]

## 2021-12-24 ENCOUNTER — Other Ambulatory Visit: Payer: Self-pay | Admitting: Internal Medicine

## 2021-12-24 DIAGNOSIS — I10 Essential (primary) hypertension: Secondary | ICD-10-CM

## 2021-12-24 DIAGNOSIS — E785 Hyperlipidemia, unspecified: Secondary | ICD-10-CM

## 2021-12-24 DIAGNOSIS — Z8673 Personal history of transient ischemic attack (TIA), and cerebral infarction without residual deficits: Secondary | ICD-10-CM

## 2021-12-26 NOTE — Telephone Encounter (Signed)
Appointment 01/12/22- over due appointment/lab. 30 day courtesy RF given Requested Prescriptions  Pending Prescriptions Disp Refills  . clopidogrel (PLAVIX) 75 MG tablet [Pharmacy Med Name: CLOPIDOGREL  TAB '75MG'$ ] 30 tablet 0    Sig: TAKE 1 TABLET DAILY     Hematology: Antiplatelets - clopidogrel Failed - 12/24/2021  2:12 AM      Failed - HCT in normal range and within 180 days    Hematocrit  Date Value Ref Range Status  01/11/2021 40.6 37.5 - 51.0 % Final         Failed - HGB in normal range and within 180 days    Hemoglobin  Date Value Ref Range Status  01/11/2021 13.9 13.0 - 17.7 g/dL Final         Failed - PLT in normal range and within 180 days    Platelets  Date Value Ref Range Status  01/11/2021 150 150 - 450 x10E3/uL Final         Failed - Valid encounter within last 6 months    Recent Outpatient Visits          11 months ago Annual physical exam   [place] [facility], Jesse Sans, MD   1 year ago Essential hypertension   [place] [facility] at [facility], Jesse Sans, MD   1 year ago Primary hypertension   [place] [facility] at [facility], Jesse Sans, MD   1 year ago Annual physical exam   [place] [facility] at [facility], Jesse Sans, MD   2 years ago Essential hypertension   [place] [facility] at [facility], Jesse Sans, MD      Future Appointments            In 2 weeks Glean Hess, MD [facility] at [facility] [place] in normal range and within 360 days    Creatinine  Date Value Ref Range Status  04/25/2012 0.81 0.60 - 1.30 mg/dL Final   Creatinine, Ser  Date Value Ref Range Status  01/11/2021 0.86 0.76 - 1.27 mg/dL Final         . atorvastatin (LIPITOR) 80 MG tablet [Pharmacy Med Name:  ATORVASTATIN TAB '80MG'$ ] 30 tablet 0    Sig: TAKE 1 TABLET DAILY     Cardiovascular:  Antilipid - Statins Failed - 12/24/2021  2:12 AM      Failed - Lipid Panel in normal range within the last 12 months    Cholesterol, Total  Date Value Ref Range Status  01/11/2021 110 100 - 199 mg/dL Final   Cholesterol  Date Value Ref Range Status  04/25/2012 162 0 - 200 mg/dL Final   Ldl Cholesterol, Calc  Date Value Ref Range Status  04/25/2012 113 (H) 0 - 100 mg/dL Final   LDL Chol Calc (NIH)  Date Value Ref Range Status  01/11/2021 46 0 - 99 mg/dL Final   HDL Cholesterol  Date Value Ref Range Status  04/25/2012 30 (L) 40 - 60 mg/dL Final   HDL  Date Value Ref Range Status  01/11/2021 51 >39 mg/dL Final   Triglycerides  Date Value Ref Range Status  01/11/2021 54 0 - 149 mg/dL Final  04/25/2012 95 0 - 200 mg/dL Final  Passed - Patient is not pregnant      Passed - Valid encounter within last 12 months    Recent Outpatient Visits          11 months ago Annual physical exam   [place] [facility] at [facility], Jesse Sans, MD   1 year ago Essential hypertension   [place] [facility] at [facility], Jesse Sans, MD   1 year ago Primary hypertension   [place] [facility] at [facility], Jesse Sans, MD   1 year ago Annual physical exam   [facility] at [facility], Jesse Sans, MD   2 years ago Essential hypertension   [place] [facility] at [facility], Jesse Sans, MD      Future Appointments            In 2 weeks Army Melia Jesse Sans, MD [facility] and [facility] Medicine at [facility]           . lisinopril (ZESTRIL) 20 MG tablet [Pharmacy Med Name: LISINOPRIL TAB '20MG'$ ] 30 tablet 0    Sig: TAKE 1 TABLET DAILY     Cardiovascular:  ACE Inhibitors Failed - 12/24/2021   2:12 AM      Failed - Cr in normal range and within 180 days    Creatinine  Date Value Ref Range Status  04/25/2012 0.81 0.60 - 1.30 mg/dL Final   Creatinine, Ser  Date Value Ref Range Status  01/11/2021 0.86 0.76 - 1.27 mg/dL Final         Failed - K in normal range and within 180 days    Potassium  Date Value Ref Range Status  01/11/2021 4.6 3.5 - 5.2 mmol/L Final  04/25/2012 4.4 3.5 - 5.1 mmol/L Final         Failed - Valid encounter within last 6 months    Recent Outpatient Visits          11 months ago Annual physical exam   [place] [facility] at [facility], Jesse Sans, MD   1 year ago Essential hypertension   [place] [facility] at [facility], Jesse Sans, MD   1 year ago Primary hypertension   [place] [facility] at [facility], Jesse Sans, MD   1 year ago Annual physical exam   [place] [facility] at [facility], Jesse Sans, MD   2 years ago Essential hypertension    Primary Care and Sports Medicine at [facility], Jesse Sans, MD      Future Appointments            In 2 weeks Army Melia Jesse Sans, MD [facility] Medicine at [facility] [place] - Patient is not pregnant      Passed - Last BP in normal range    BP Readings from Last 1 Encounters:  01/11/21 126/88

## 2022-01-12 ENCOUNTER — Ambulatory Visit (INDEPENDENT_AMBULATORY_CARE_PROVIDER_SITE_OTHER): Payer: Medicare HMO | Admitting: Internal Medicine

## 2022-01-12 ENCOUNTER — Encounter: Payer: Self-pay | Admitting: Internal Medicine

## 2022-01-12 VITALS — BP 118/62 | HR 62 | Ht 69.0 in | Wt 171.0 lb

## 2022-01-12 DIAGNOSIS — E785 Hyperlipidemia, unspecified: Secondary | ICD-10-CM

## 2022-01-12 DIAGNOSIS — I10 Essential (primary) hypertension: Secondary | ICD-10-CM

## 2022-01-12 DIAGNOSIS — Z8673 Personal history of transient ischemic attack (TIA), and cerebral infarction without residual deficits: Secondary | ICD-10-CM | POA: Diagnosis not present

## 2022-01-12 DIAGNOSIS — Z0000 Encounter for general adult medical examination without abnormal findings: Secondary | ICD-10-CM | POA: Diagnosis not present

## 2022-01-12 DIAGNOSIS — I700 Atherosclerosis of aorta: Secondary | ICD-10-CM

## 2022-01-12 DIAGNOSIS — Z8546 Personal history of malignant neoplasm of prostate: Secondary | ICD-10-CM | POA: Diagnosis not present

## 2022-01-12 DIAGNOSIS — G3184 Mild cognitive impairment, so stated: Secondary | ICD-10-CM | POA: Diagnosis not present

## 2022-01-12 MED ORDER — LISINOPRIL 20 MG PO TABS: 20.0000 mg | ORAL_TABLET | Freq: Every day | ORAL | 1 refills | Status: DC

## 2022-01-12 MED ORDER — ATORVASTATIN CALCIUM 80 MG PO TABS: 80.0000 mg | ORAL_TABLET | Freq: Every day | ORAL | 1 refills | Status: DC

## 2022-01-12 MED ORDER — CLOPIDOGREL BISULFATE 75 MG PO TABS: 75.0000 mg | ORAL_TABLET | Freq: Every day | ORAL | 1 refills | Status: DC

## 2022-01-12 NOTE — Progress Notes (Signed)
Date:  01/12/2022   Name:  [name]   DOB:  Aug 09, 1935   MRN:  [id number]   Chief Complaint: Hypertension and Hot Flashes Mabry Tift is a 86 y.o. male who presents today for his Complete Annual Exam. He feels fairly well. He reports exercising some. He reports he is sleeping fairly well.   Colonoscopy: aged out  Immunization History  Administered Date(s) Administered   PFIZER(Purple Top)SARS-COV-2 Vaccination 05/26/2019, 06/16/2019   There are no preventive care reminders to display for this patient.   Lab Results  Component Value Date   PSA1 <0.1 01/11/2021   PSA1 <0.1 01/07/2020   PSA1 <0.1 05/24/2017   PSA 0.05 05/11/2016   PSA 0.04 05/06/2015   PSA 0.09 10/29/2014    Hypertension This is a chronic problem. The problem is controlled. Pertinent negatives include no chest pain, headaches, palpitations or shortness of breath. Past treatments include ACE inhibitors. The current treatment provides significant improvement. Hypertensive end-organ damage includes CVA.  Hyperlipidemia This is a chronic problem. The problem is controlled. Pertinent negatives include no chest pain, myalgias or shortness of breath. Current antihyperlipidemic treatment includes statins.  Neck fracture - he fell in February and sustained a fracture of the first cervical vertebra.  Also had a SAH.  He was hospitalized briefly, wore a collar for 2 months then was released. CT Head 06/13/21: FINDINGS:  Small amount of hyperattenuating material in the right frontal lobe gyri adjacent to the falx consistent with subarachnoid hemorrhage. There is no midline shift. No mass lesion. There is no evidence of acute infarct. Severe microvascular changes. The sinuses are pneumatized.   Nondisplaced fracture of the anterior ring of C1 better demonstrated on same day CT cervical spine.   3 soft tissue lesions of the left scalp, some of which have associated calcifications (3:77, 52, 19). These likely represent  partially calcified epidermoid cysts, but skin malignancy cannot be entirely ruled out, especially for the lesion at image 52. Lab Results  Component Value Date   NA 142 01/11/2021   K 4.6 01/11/2021   CO2 22 01/11/2021   GLUCOSE 86 01/11/2021   BUN 15 01/11/2021   CREATININE 0.86 01/11/2021   CALCIUM 8.9 01/11/2021   EGFR 86 01/11/2021   GFRNONAA 82 01/07/2020   Lab Results  Component Value Date   CHOL 110 01/11/2021   HDL 51 01/11/2021   LDLCALC 46 01/11/2021   TRIG 54 01/11/2021   CHOLHDL 2.2 01/11/2021   Lab Results  Component Value Date   TSH 1.520 06/28/2017   No results found for: "HGBA1C" Lab Results  Component Value Date   WBC 4.2 01/11/2021   HGB 13.9 01/11/2021   HCT 40.6 01/11/2021   MCV 95 01/11/2021   PLT 150 01/11/2021   Lab Results  Component Value Date   ALT 14 01/11/2021   AST 18 01/11/2021   ALKPHOS 81 01/11/2021   BILITOT 0.4 01/11/2021   No results found for: "25OHVITD2", "25OHVITD3", "VD25OH"   Review of Systems  Constitutional:  Negative for appetite change, chills, diaphoresis, fatigue and unexpected weight change.  HENT:  Positive for hearing loss. Negative for tinnitus, trouble swallowing and voice change.   Eyes:  Negative for visual disturbance.  Respiratory:  Negative for choking, shortness of breath and wheezing.   Cardiovascular:  Negative for chest pain, palpitations and leg swelling.  Gastrointestinal:  Negative for abdominal pain, blood in stool, constipation and diarrhea.  Genitourinary:  Negative for difficulty urinating, dysuria and frequency.  Musculoskeletal:  Negative for arthralgias, back pain and myalgias.  Skin:  Negative for color change and rash.  Neurological:  Negative for dizziness, syncope and headaches.  Hematological:  Negative for adenopathy.  Psychiatric/Behavioral:  Positive for confusion (wife reports one episode of getting lost while driving.  He has since stopped driving). Negative for dysphoric mood and  sleep disturbance. The patient is not nervous/anxious.     Patient Active Problem List   Diagnosis Date Noted   Bilateral carotid artery stenosis 04/28/2020   Moderate aortic valve stenosis 12/12/2016   Aortic atherosclerosis ([place]) 12/12/2016   Seasonal allergies 07/14/2016   Primary hypertension 07/14/2016   Personal history of prostate cancer 11/23/2014   Dyslipidemia 09/03/2014   Edema of extremities 09/03/2014   History of CVA (cerebrovascular accident) without residual deficits 03/31/2012    No Known Allergies  Past Surgical History:  Procedure Laterality Date   INGUINAL HERNIA REPAIR Left     Social History   Tobacco Use   Smoking status: Former    Packs/day: 0.50    Years: 50.00    Total pack years: 25.00    Types: Cigarettes    Quit date: 05/02/2011    Years since quitting: 10.7   Smokeless tobacco: Never   Tobacco comments:    smoking cessation materials not required  Vaping Use   Vaping Use: Never used  Substance Use Topics   Alcohol use: No   Drug use: No     Medication list has been reviewed and updated.  Current Meds  Medication Sig   calcium-vitamin D (OSCAL WITH D) 500-5 MG-MCG tablet Take 1 tablet by mouth daily with breakfast.   vitamin B-12 (CYANOCOBALAMIN) 1000 MCG tablet Take 1,000 mcg by mouth 2 (two) times a week.   [DISCONTINUED] atorvastatin (LIPITOR) 80 MG tablet TAKE 1 TABLET DAILY   [DISCONTINUED] clopidogrel (PLAVIX) 75 MG tablet TAKE 1 TABLET DAILY   [DISCONTINUED] lisinopril (ZESTRIL) 20 MG tablet TAKE 1 TABLET DAILY       01/12/2022   10:05 AM 01/11/2021   10:25 AM 10/12/2020    9:03 AM 07/08/2020   11:25 AM  GAD 7 : Generalized Anxiety Score  Nervous, Anxious, on Edge 1 0 0 0  Control/stop worrying 1 0 0 0  Worry too much - different things 1 0 0 0  Trouble relaxing 1 0 0 0  Restless 1 0 0 0  Easily annoyed or irritable 1 1 0 0  Afraid - awful might happen 1 0 0 0  Total GAD 7 Score 7 1 0 0  Anxiety Difficulty Somewhat  difficult  Not difficult at all        01/12/2022   10:05 AM 05/09/2021    9:30 AM 01/11/2021   10:25 AM  Depression screen PHQ 2/9  Decreased Interest 0 0 0  Down, Depressed, Hopeless 0 0 0  PHQ - 2 Score 0 0 0  Altered sleeping 1  1  Tired, decreased energy 1  1  Change in appetite 0  0  Feeling bad or failure about yourself  0  0  Trouble concentrating 0  0  Moving slowly or fidgety/restless 2  1  Suicidal thoughts 0  0  PHQ-9 Score 4  3  Difficult doing work/chores Not difficult at all  Somewhat difficult      01/12/2022   10:37 AM 01/02/2019    9:13 AM 01/02/2018    2:53 PM  6CIT Screen  What Year? 0 points 0 points  0 points  What month? 0 points 0 points 0 points  What time? 0 points 0 points 0 points  Count back from 20 0 points 0 points 0 points  Months in reverse 0 points 0 points 0 points  Repeat phrase 2 points 0 points 2 points  Total Score 2 points 0 points 2 points      BP Readings from Last 3 Encounters:  01/12/22 118/62  01/11/21 126/88  10/12/20 134/82    Physical Exam Vitals and nursing note reviewed.  Constitutional:      Appearance: Normal appearance. He is well-developed.  HENT:     Head: Normocephalic.     Right Ear: Tympanic membrane, ear canal and external ear normal.     Left Ear: Tympanic membrane, ear canal and external ear normal.     Nose: Nose normal.  Eyes:     Conjunctiva/sclera: Conjunctivae normal.     Pupils: Pupils are equal, round, and reactive to light.  Neck:     Thyroid: No thyromegaly.     Vascular: No carotid bruit.  Cardiovascular:     Rate and Rhythm: Normal rate and regular rhythm.     Heart sounds: Normal heart sounds.  Pulmonary:     Effort: Pulmonary effort is normal.     Breath sounds: Normal breath sounds. No wheezing.  Chest:  Breasts:    Right: No mass.     Left: No mass.  Abdominal:     General: Bowel sounds are normal.     Palpations: Abdomen is soft.     Tenderness: There is no abdominal tenderness.   Musculoskeletal:        General: Normal range of motion.     Cervical back: Normal range of motion and neck supple.  Lymphadenopathy:     Cervical: No cervical adenopathy.  Skin:    General: Skin is warm and dry.  Neurological:     Mental Status: He is alert and oriented to person, place, and time.     Deep Tendon Reflexes: Reflexes are normal and symmetric.  Psychiatric:        Attention and Perception: Attention normal.        Mood and Affect: Mood normal.        Thought Content: Thought content normal.     Wt Readings from Last 3 Encounters:  01/12/22 171 lb (77.6 kg)  01/11/21 172 lb (78 kg)  10/12/20 174 lb (78.9 kg)    BP 118/62   Pulse 62   Ht 5' 9" (1.753 m)   Wt 171 lb (77.6 kg)   SpO2 96%   BMI 25.25 kg/m   Assessment and Plan: 1. Annual physical exam Normal exam. He is not nearly as active as previously.  I recommended PT for balance and strengthening but he declines He declines the flu vaccine  2. Dyslipidemia Continue statin - atorvastatin (LIPITOR) 80 MG tablet; Take 1 tablet (80 mg total) by mouth daily.  Dispense: 90 tablet; Refill: 1 - Lipid panel  3. History of CVA (cerebrovascular accident) without residual deficits On Plavix without significant bleeding - he did have a small SAH in February  - clopidogrel (PLAVIX) 75 MG tablet; Take 1 tablet (75 mg total) by mouth daily.  Dispense: 90 tablet; Refill: 1  4. Essential hypertension Clinically stable exam with well controlled BP. Tolerating medications without side effects at this time. Pt to continue current regimen and low sodium diet; benefits of regular exercise as able discussed. - lisinopril (ZESTRIL)  20 MG tablet; Take 1 tablet (20 mg total) by mouth daily.  Dispense: 90 tablet; Refill: 1 - POCT Urinalysis Dipstick - CBC with Differential/Platelet - Comprehensive metabolic panel  5. MCI (mild cognitive impairment) Agree with limited driving.  6. Aortic atherosclerosis ([place]) With  abdominal bruit On statin and Plavix He denies intestinal angina, rest pain or claudication  7. Personal history of prostate cancer No longer seeing Oncology or Urology - PSA   Partially dictated using Dragon software. Any errors are unintentional.  Halina Maidens, MD [facility]  01/12/2022

## 2022-01-13 ENCOUNTER — Telehealth: Payer: Self-pay | Admitting: Internal Medicine

## 2022-01-13 LAB — COMPREHENSIVE METABOLIC PANEL
ALT: 16 IU/L (ref 0–44)
AST: 18 IU/L (ref 0–40)
Albumin/Globulin Ratio: 1.6 (ref 1.2–2.2)
Albumin: 3.7 g/dL (ref 3.7–4.7)
Alkaline Phosphatase: 84 IU/L (ref 44–121)
BUN/Creatinine Ratio: 15 (ref 10–24)
BUN: 15 mg/dL (ref 8–27)
Bilirubin Total: 0.4 mg/dL (ref 0.0–1.2)
CO2: 22 mmol/L (ref 20–29)
Calcium: 9.1 mg/dL (ref 8.6–10.2)
Chloride: 105 mmol/L (ref 96–106)
Creatinine, Ser: 1 mg/dL (ref 0.76–1.27)
Globulin, Total: 2.3 g/dL (ref 1.5–4.5)
Glucose: 91 mg/dL (ref 70–99)
Potassium: 5.4 mmol/L — ABNORMAL HIGH (ref 3.5–5.2)
Sodium: 141 mmol/L (ref 134–144)
Total Protein: 6 g/dL (ref 6.0–8.5)
eGFR: 74 mL/min/{1.73_m2} (ref >59)

## 2022-01-13 LAB — CBC WITH DIFFERENTIAL/PLATELET
Basophils Absolute: 0 10*3/uL (ref 0.0–0.2)
Basos: 1 %
EOS (ABSOLUTE): 0.1 10*3/uL (ref 0.0–0.4)
Eos: 2 %
Hematocrit: 42.7 % (ref 37.5–51.0)
Hemoglobin: 14.3 g/dL (ref 13.0–17.7)
Immature Grans (Abs): 0 10*3/uL (ref 0.0–0.1)
Immature Granulocytes: 0 %
Lymphocytes Absolute: 1.1 10*3/uL (ref 0.7–3.1)
Lymphs: 21 %
MCH: 32.4 pg (ref 26.6–33.0)
MCHC: 33.5 g/dL (ref 31.5–35.7)
MCV: 97 fL (ref 79–97)
Monocytes Absolute: 0.4 10*3/uL (ref 0.1–0.9)
Monocytes: 7 %
Neutrophils Absolute: 3.5 10*3/uL (ref 1.4–7.0)
Neutrophils: 69 %
Platelets: 159 10*3/uL (ref 150–450)
RBC: 4.42 x10E6/uL (ref 4.14–5.80)
RDW: 12.6 % (ref 11.6–15.4)
WBC: 5.1 10*3/uL (ref 3.4–10.8)

## 2022-01-13 LAB — LIPID PANEL
Chol/HDL Ratio: 2.1 ratio (ref 0.0–5.0)
Cholesterol, Total: 109 mg/dL (ref 100–199)
HDL: 52 mg/dL (ref >39)
LDL Chol Calc (NIH): 44 mg/dL (ref 0–99)
Triglycerides: 54 mg/dL (ref 0–149)
VLDL Cholesterol Cal: 13 mg/dL (ref 5–40)

## 2022-01-13 NOTE — Telephone Encounter (Signed)
Pt is calling to cancel 05/10/21 AWV.

## 2022-01-18 LAB — PSA: Prostate Specific Ag, Serum: <0.1 ng/mL (ref 0.0–4.0)

## 2022-01-23 ENCOUNTER — Other Ambulatory Visit: Payer: Self-pay | Admitting: Internal Medicine

## 2022-01-23 DIAGNOSIS — Z8673 Personal history of transient ischemic attack (TIA), and cerebral infarction without residual deficits: Secondary | ICD-10-CM

## 2022-01-23 DIAGNOSIS — E785 Hyperlipidemia, unspecified: Secondary | ICD-10-CM

## 2022-01-23 DIAGNOSIS — I10 Essential (primary) hypertension: Secondary | ICD-10-CM

## 2022-01-23 NOTE — Telephone Encounter (Signed)
All 3 refilled 01/12/2022 #[id number] rf. Requested Prescriptions  Pending Prescriptions Disp Refills  . atorvastatin (LIPITOR) 80 MG tablet [Pharmacy Med Name: ATORVASTATIN TAB '80MG'$ ] 30 tablet 0    Sig: TAKE 1 TABLET DAILY     Cardiovascular:  Antilipid - Statins Failed - 01/23/2022  8:12 AM      Failed - Lipid Panel in normal range within the last 12 months    Cholesterol, Total  Date Value Ref Range Status  01/12/2022 109 100 - 199 mg/dL Final   Cholesterol  Date Value Ref Range Status  04/25/2012 162 0 - 200 mg/dL Final   Ldl Cholesterol, Calc  Date Value Ref Range Status  04/25/2012 113 (H) 0 - 100 mg/dL Final   LDL Chol Calc (NIH)  Date Value Ref Range Status  01/12/2022 44 0 - 99 mg/dL Final   HDL Cholesterol  Date Value Ref Range Status  04/25/2012 30 (L) 40 - 60 mg/dL Final   HDL  Date Value Ref Range Status  01/12/2022 52 >39 mg/dL Final   Triglycerides  Date Value Ref Range Status  01/12/2022 54 0 - 149 mg/dL Final  04/25/2012 95 0 - 200 mg/dL Final         Passed - Patient is not pregnant      Passed - Valid encounter within last 12 months    Recent Outpatient Visits          1 week ago Annual physical exam   [place] [facility] at [facility], Jesse Sans, MD   1 year ago Annual physical exam   [place] [facility] at [facility], Jesse Sans, MD   1 year ago Essential hypertension   [place] [facility] at [facility], Jesse Sans, MD   1 year ago Primary hypertension    Primary Care and Sports Medicine at [facility], Jesse Sans, MD   2 years ago Annual physical exam   [facility] at [facility], Jesse Sans, MD      Future Appointments            In 5 months Army Melia, Jesse Sans, MD [facility] at [facility], [facility]           . lisinopril  (ZESTRIL) 20 MG tablet [Pharmacy Med Name: LISINOPRIL TAB '20MG'$ ] 30 tablet 0    Sig: TAKE 1 TABLET DAILY     Cardiovascular:  ACE Inhibitors Failed - 01/23/2022  8:12 AM      Failed - K in normal range and within 180 days    Potassium  Date Value Ref Range Status  01/12/2022 5.4 (H) 3.5 - 5.2 mmol/L Final  04/25/2012 4.4 3.5 - 5.1 mmol/L Final         Passed - Cr in normal range and within 180 days    Creatinine  Date Value Ref Range Status  04/25/2012 0.81 0.60 - 1.30 mg/dL Final   Creatinine, Ser  Date Value Ref Range Status  01/12/2022 1.00 0.76 - 1.27 mg/dL Final         Passed - Patient is not pregnant      Passed - Last BP in normal range    BP Readings from Last 1 Encounters:  01/12/22 118/62         Passed - Valid encounter within last 6 months  Recent Outpatient Visits          1 week ago Annual physical exam   [place] [facility] at [facility], Jesse Sans, MD   1 year ago Annual physical exam   [place] [facility] at [facility], Jesse Sans, MD   1 year ago Essential hypertension   [place] [facility] at [facility], Jesse Sans, MD   1 year ago Primary hypertension   [place] [facility] at [facility], Jesse Sans, MD   2 years ago Annual physical exam   [facility] at [facility], Jesse Sans, MD      Future Appointments            In 5 months Army Melia, Jesse Sans, MD [facility] at [facility], [facility]           . clopidogrel (PLAVIX) 75 MG tablet [Pharmacy Med Name: CLOPIDOGREL  TAB '75MG'$ ] 30 tablet 0    Sig: TAKE 1 TABLET DAILY     Hematology: Antiplatelets - clopidogrel Passed - 01/23/2022  8:12 AM      Passed - HCT in normal range and within 180 days    Hematocrit  Date Value Ref Range Status  01/12/2022 42.7 37.5 - 51.0 % Final          Passed - HGB in normal range and within 180 days    Hemoglobin  Date Value Ref Range Status  01/12/2022 14.3 13.0 - 17.7 g/dL Final         Passed - PLT in normal range and within 180 days    Platelets  Date Value Ref Range Status  01/12/2022 159 150 - 450 x10E3/uL Final         Passed - Cr in normal range and within 360 days    Creatinine  Date Value Ref Range Status  04/25/2012 0.81 0.60 - 1.30 mg/dL Final   Creatinine, Ser  Date Value Ref Range Status  01/12/2022 1.00 0.76 - 1.27 mg/dL Final         Passed - Valid encounter within last 6 months    Recent Outpatient Visits          1 week ago Annual physical exam   [place] [facility] at [facility], Jesse Sans, MD   1 year ago Annual physical exam   [place] [facility] at [facility], Jesse Sans, MD   1 year ago Essential hypertension   [place] [facility] Medicine at [facility], Jesse Sans, MD   1 year ago Primary hypertension   [place] [facility] at [facility], Jesse Sans, MD   2 years ago Annual physical exam   [facility] Medicine at [facility], Jesse Sans, MD      Future Appointments            In 5 months Army Melia, Jesse Sans, MD [place] [facility] at [facility], [facility]

## 2022-05-10 ENCOUNTER — Ambulatory Visit: Payer: Medicare HMO

## 2022-05-19 ENCOUNTER — Telehealth: Payer: Self-pay | Admitting: Internal Medicine

## 2022-05-19 NOTE — Telephone Encounter (Signed)
Copied from [place] [phone]. Topic: Medicare AWV >> May 19, 2022  1:14 PM Devoria Glassing wrote: Reason for CRM: Called patient to rescheduled cancelled AWV.  Please call Juliann Pulse at [phone] to schedule

## 2022-06-01 ENCOUNTER — Ambulatory Visit (INDEPENDENT_AMBULATORY_CARE_PROVIDER_SITE_OTHER): Payer: Medicare HMO

## 2022-06-01 VITALS — Ht 69.0 in | Wt 171.0 lb

## 2022-06-01 DIAGNOSIS — Z0000 Encounter for general adult medical examination without abnormal findings: Secondary | ICD-10-CM

## 2022-06-01 NOTE — Patient Instructions (Signed)
[name] , Thank you for taking time to come for your Medicare Wellness Visit. I appreciate your ongoing commitment to your health goals. Please review the following plan we discussed and let me know if I can assist you in the future.   These are the goals we discussed:  Goals      DIET - EAT MORE FRUITS AND VEGETABLES     DIET - INCREASE WATER INTAKE     Recommend to drink at least 6-8 8oz glasses of water per day.     Prevent Falls     Recommend to remove items from your home that may cause you to trip or slip        This is a list of the screening recommended for you and due dates:  Health Maintenance  Topic Date Due   Zoster (Shingles) Vaccine (1 of 2) Never done   Pneumonia Vaccine (1 - PCV) Never done   COVID-19 Vaccine (3 - Pfizer risk series) 07/14/2019   Flu Shot  07/30/2022*   Medicare Annual Wellness Visit  06/02/2023   DTaP/Tdap/Td vaccine (2 - Td or Tdap) 06/14/2031   HPV Vaccine  Aged Out  *Topic was postponed. The date shown is not the original due date.    Advanced directives: yes  Conditions/risks identified: none  Next appointment: Follow up in one year for your annual wellness visit 06/06/23 @ 1:30 pm by phone   Preventive Care 65 Years and Older, Male Preventive care refers to lifestyle choices and visits with your health care provider that can promote health and wellness. What does preventive care include? A yearly physical exam. This is also called an annual well check. Dental exams once or twice a year. Routine eye exams. Ask your health care provider how often you should have your eyes checked. Personal lifestyle choices, including: Daily care of your teeth and gums. Regular physical activity. Eating a healthy diet. Avoiding tobacco and drug use. Limiting alcohol use. Practicing safe sex. Taking low-dose aspirin every day. Taking vitamin and mineral supplements as recommended by your health care provider. What happens during an annual well  check? The services and screenings done by your health care provider during your annual well check will depend on your age, overall health, lifestyle risk factors, and family history of disease. Counseling  Your health care provider may ask you questions about your: Alcohol use. Tobacco use. Drug use. Emotional well-being. Home and relationship well-being. Sexual activity. Eating habits. History of falls. Memory and ability to understand (cognition). Work and work Statistician. Reproductive health. Screening  You may have the following tests or measurements: Height, weight, and BMI. Blood pressure. Lipid and cholesterol levels. These may be checked every 5 years, or more frequently if you are over 32 years old. Skin check. Lung cancer screening. You may have this screening every year starting at age 23 if you have a 30-pack-year history of smoking and currently smoke or have quit within the past 15 years. Fecal occult blood test (FOBT) of the stool. You may have this test every year starting at age 65. Flexible sigmoidoscopy or colonoscopy. You may have a sigmoidoscopy every 5 years or a colonoscopy every 10 years starting at age 45. Hepatitis C blood test. Hepatitis B blood test. Sexually transmitted disease (STD) testing. Diabetes screening. This is done by checking your blood sugar (glucose) after you have not eaten for a while (fasting). You may have this done every 1-3 years. Bone density scan. This is done to  screen for osteoporosis. You may have this done starting at age 5. Mammogram. This may be done every 1-2 years. Talk to your health care provider about how often you should have regular mammograms. Talk with your health care provider about your test results, treatment options, and if necessary, the need for more tests. Vaccines  Your health care provider may recommend certain vaccines, such as: Influenza vaccine. This is recommended every year. Tetanus, diphtheria, and  acellular pertussis (Tdap, Td) vaccine. You may need a Td booster every 10 years. Zoster vaccine. You may need this after age 44. Pneumococcal 13-valent conjugate (PCV13) vaccine. One dose is recommended after age 39. Pneumococcal polysaccharide (PPSV23) vaccine. One dose is recommended after age 33. Talk to your health care provider about which screenings and vaccines you need and how often you need them. This information is not intended to replace advice given to you by your health care provider. Make sure you discuss any questions you have with your health care provider. Document Released: 05/14/2015 Document Revised: 01/05/2016 Document Reviewed: 02/16/2015 Elsevier Interactive Patient Education  2017 [place] Prevention in the Home Falls can cause injuries. They can happen to people of all ages. There are many things you can do to make your home safe and to help prevent falls. What can I do on the outside of my home? Regularly fix the edges of walkways and driveways and fix any cracks. Remove anything that might make you trip as you walk through a door, such as a raised step or threshold. Trim any bushes or trees on the path to your home. Use bright outdoor lighting. Clear any walking paths of anything that might make someone trip, such as rocks or tools. Regularly check to see if handrails are loose or broken. Make sure that both sides of any steps have handrails. Any raised decks and porches should have guardrails on the edges. Have any leaves, snow, or ice cleared regularly. Use sand or salt on walking paths during winter. Clean up any spills in your garage right away. This includes oil or grease spills. What can I do in the bathroom? Use night lights. Install grab bars by the toilet and in the tub and shower. Do not use towel bars as grab bars. Use non-skid mats or decals in the tub or shower. If you need to sit down in the shower, use a plastic, non-slip stool. Keep  the floor dry. Clean up any water that spills on the floor as soon as it happens. Remove soap buildup in the tub or shower regularly. Attach bath mats securely with double-sided non-slip rug tape. Do not have throw rugs and other things on the floor that can make you trip. What can I do in the bedroom? Use night lights. Make sure that you have a light by your bed that is easy to reach. Do not use any sheets or blankets that are too big for your bed. They should not hang down onto the floor. Have a firm chair that has side arms. You can use this for support while you get dressed. Do not have throw rugs and other things on the floor that can make you trip. What can I do in the kitchen? Clean up any spills right away. Avoid walking on wet floors. Keep items that you use a lot in easy-to-reach places. If you need to reach something above you, use a strong step stool that has a grab bar. Keep electrical cords out of the way.  Do not use floor polish or wax that makes floors slippery. If you must use wax, use non-skid floor wax. Do not have throw rugs and other things on the floor that can make you trip. What can I do with my stairs? Do not leave any items on the stairs. Make sure that there are handrails on both sides of the stairs and use them. Fix handrails that are broken or loose. Make sure that handrails are as long as the stairways. Check any carpeting to make sure that it is firmly attached to the stairs. Fix any carpet that is loose or worn. Avoid having throw rugs at the top or bottom of the stairs. If you do have throw rugs, attach them to the floor with carpet tape. Make sure that you have a light switch at the top of the stairs and the bottom of the stairs. If you do not have them, ask someone to add them for you. What else can I do to help prevent falls? Wear shoes that: Do not have high heels. Have rubber bottoms. Are comfortable and fit you well. Are closed at the toe. Do not  wear sandals. If you use a stepladder: Make sure that it is fully opened. Do not climb a closed stepladder. Make sure that both sides of the stepladder are locked into place. Ask someone to hold it for you, if possible. Clearly mark and make sure that you can see: Any grab bars or handrails. First and last steps. Where the edge of each step is. Use tools that help you move around (mobility aids) if they are needed. These include: Canes. Walkers. Scooters. Crutches. Turn on the lights when you go into a dark area. Replace any light bulbs as soon as they burn out. Set up your furniture so you have a clear path. Avoid moving your furniture around. If any of your floors are uneven, fix them. If there are any pets around you, be aware of where they are. Review your medicines with your doctor. Some medicines can make you feel dizzy. This can increase your chance of falling. Ask your doctor what other things that you can do to help prevent falls. This information is not intended to replace advice given to you by your health care provider. Make sure you discuss any questions you have with your health care provider. Document Released: 02/11/2009 Document Revised: 09/23/2015 Document Reviewed: 05/22/2014 Elsevier Interactive Patient Education  2017 [employer].

## 2022-06-01 NOTE — Progress Notes (Addendum)
Virtual Visit via Telephone Note  I connected with  [name] on 06/06/22 at  9:45 AM EST by telephone and verified that I am speaking with the correct person using two identifiers.  Location: Patient: home Provider: [facility] Persons participating in the virtual visit: [place]   I discussed the limitations, risks, security and privacy concerns of performing an evaluation and management service by telephone and the availability of in person appointments. The patient expressed understanding and agreed to proceed.  Interactive audio and video telecommunications were attempted between this nurse and patient, however failed, due to patient having technical difficulties OR patient did not have access to video capability.  We continued and completed visit with audio only.  Some vital signs may be absent or patient reported.   Dionisio David, LPN  Subjective:   [name] is a 87 y.o. male who presents for Medicare Annual/Subsequent preventive examination.  Review of Systems     Cardiac Risk Factors include: advanced age (>54mn, >>29women);dyslipidemia;hypertension;male gender     Objective:    There were no vitals filed for this visit. There is no height or weight on file to calculate BMI.     06/01/2022    9:51 AM 05/09/2021    9:31 AM 05/05/2020    9:48 AM 03/19/2019    3:28 PM 05/30/2018   10:00 AM 01/02/2018    2:54 PM 05/31/2017    9:46 AM  Advanced Directives  Does Patient Have a Medical Advance Directive? Yes Yes Yes Yes No Yes No  Type of AParamedicof [place]Living will HLinnLiving will HCathlametLiving will HGlenaireLiving will  H[place]hounLiving will   Does patient want to make changes to medical advance directive? No - Patient declined        Copy of HLivermorein Chart? Yes - validated most recent copy scanned in chart (See row  information) Yes - validated most recent copy scanned in chart (See row information) Yes - validated most recent copy scanned in chart (See row information) Yes - validated most recent copy scanned in chart (See row information)  Yes   Would patient like information on creating a medical advance directive?     No - Patient declined      Current Medications (verified) Outpatient Encounter Medications as of 06/01/2022  Medication Sig   atorvastatin (LIPITOR) 80 MG tablet Take 1 tablet (80 mg total) by mouth daily.   calcium-vitamin D (OSCAL WITH D) 500-5 MG-MCG tablet Take 1 tablet by mouth daily with breakfast.   clopidogrel (PLAVIX) 75 MG tablet Take 1 tablet (75 mg total) by mouth daily.   lisinopril (ZESTRIL) 20 MG tablet Take 1 tablet (20 mg total) by mouth daily.   vitamin B-12 (CYANOCOBALAMIN) 1000 MCG tablet Take 1,000 mcg by mouth 2 (two) times a week.   No facility-administered encounter medications on file as of 06/01/2022.    Allergies (verified) Patient has no known allergies.   History: Past Medical History:  Diagnosis Date   Edema extremities    Elevated PSA    HLD (hyperlipidemia)    Hypertension    Kidney stones    Prostate cancer ([facility]    St[facility] ([facility]    Past Surgical History:  Procedure Laterality Date   INGUINAL HERNIA REPAIR Left    Family History  Problem Relation Age of Onset   Parkinson's disease Mother    Narcolepsy Mother  Stroke Brother    Prostate cancer Brother    Congestive Heart Failure Brother    Heart disease Brother    Alzheimer's disease Daughter    Kidney disease Neg Hx    Kidney cancer Neg Hx    Bladder Cancer Neg Hx    Social History   Socioeconomic History   Marital status: Married    Spouse name: Not on file   Number of children: 2   Years of education: Not on file   Highest education level: GED or equivalent  Occupational History   Occupation: Retired  Tobacco Use   Smoking status: Former    Packs/day: 0.50    Years:  50.00    Total pack years: 25.00    Types: Cigarettes    Quit date: 05/02/2011    Years since quitting: 11.0   Smokeless tobacco: Never   Tobacco comments:    smoking cessation materials not required  Vaping Use   Vaping Use: Never used  Substance and Sexual Activity   Alcohol use: No   Drug use: No   Sexual activity: Not Currently    Birth control/protection: None  Other Topics Concern   Not on file  Social History Narrative   Not on file   Social Determinants of Health   Financial Resource Strain: Low Risk  (06/01/2022)   Overall Financial Resource Strain (CARDIA)    Difficulty of Paying Living Expenses: Not hard at all  Food Insecurity: No Food Insecurity (06/01/2022)   Hunger Vital Sign    Worried About Running Out of Food in the Last Year: Never true    [place] in the Last Year: Never true  Transportation Needs: No Transportation Needs (06/01/2022)   PRAPARE - Hydrologist (Medical): No    Lack of Transportation (Non-Medical): No  Physical Activity: Inactive (06/01/2022)   Exercise Vital Sign    Days of Exercise per Week: 0 days    Minutes of Exercise per Session: 0 min  Stress: No Stress Concern Present (06/01/2022)   [place]    Feeling of Stress : Only a little  Social Connections: Moderately Integrated (06/01/2022)   Social Connection and Isolation Panel [NHANES]    Frequency of Communication with Friends and Family: Never    Frequency of Social Gatherings with Friends and Family: More than three times a week    Attends Religious Services: More than 4 times per year    Active Member of [employer] or Organizations: No    Attends Music therapist: Never    Marital Status: Married    Tobacco Counseling Counseling given: Not Answered Tobacco comments: smoking cessation materials not required   Clinical Intake:  Pre-visit preparation completed: Yes  Pain :  No/denies pain     Nutritional Risks: None Diabetes: No  How often do you need to have someone help you when you read instructions, pamphlets, or other written materials from your doctor or pharmacy?: 1 - Never  Diabetic?no  Interpreter Needed?: No  Information entered by :: Kirke Shaggy, LPN   Activities of Daily Living    06/01/2022    9:53 AM  In your present state of health, do you have any difficulty performing the following activities:  Hearing? 1  Vision? 0  Difficulty concentrating or making decisions? 0  Walking or climbing stairs? 0  Dressing or bathing? 0  Doing errands, shopping? 0  Preparing Food and eating ?  N  Using the Toilet? N  In the past six months, have you accidently leaked urine? N  Do you have problems with loss of bowel control? N  Managing your Medications? N  Managing your Finances? N  Housekeeping or managing your Housekeeping? N    Patient Care Team: Glean Hess, MD as PCP - General (Internal Medicine) Corey Skains, MD as Consulting Physician (Cardiology)  Indicate any recent Medical Services you may have received from other than [facility] providers in the past year (date may be approximate).     Assessment:   This is a routine wellness examination for [name].  Hearing/Vision screen Hearing Screening - Comments:: No aids Vision Screening - Comments:: Readers   Dietary issues and exercise activities discussed: Current Exercise Habits: The patient does not participate in regular exercise at present   Goals Addressed             This Visit's Progress    DIET - EAT MORE FRUITS AND VEGETABLES         Depression Screen    06/01/2022    9:49 AM 01/12/2022   10:05 AM 05/09/2021    9:30 AM 01/11/2021   10:25 AM 10/12/2020    9:03 AM 07/08/2020   11:25 AM 05/05/2020    9:45 AM  PHQ 2/9 Scores  PHQ - 2 Score 0 0 0 0 0 0 0  PHQ- 9 Score 0 4  3 0 0     Fall Risk    06/01/2022    9:52 AM 01/12/2022   10:05 AM 05/09/2021    9:31  AM 01/11/2021   10:25 AM 10/12/2020    9:02 AM  Fall Risk   Falls in the past year? 1 1 0 0 0  Number falls in past yr: 1 1 0 0   Injury with Fall? 1 0 0 0   Risk for fall due to : History of fall(s) History of fall(s) No Fall Risks No Fall Risks   Follow up Falls prevention discussed;Falls evaluation completed Falls evaluation completed Falls prevention discussed Falls evaluation completed Falls evaluation completed    FALL RISK PREVENTION PERTAINING TO THE HOME:  Any stairs in or around the home? Yes  If so, are there any without handrails? No  Home free of loose throw rugs in walkways, pet beds, electrical cords, etc? Yes  Adequate lighting in your home to reduce risk of falls? Yes   ASSISTIVE DEVICES UTILIZED TO PREVENT FALLS:  Life alert? No  Use of a cane, walker or w/c? No  Grab bars in the bathroom? Yes  Shower chair or bench in shower? No  Elevated toilet seat or a handicapped toilet? No   Cognitive Function:        06/01/2022    9:59 AM 06/01/2022    9:57 AM 01/12/2022   10:37 AM 01/02/2019    9:13 AM 01/02/2018    2:53 PM  6CIT Screen  What Year? 0 points 0 points 0 points 0 points 0 points  What month? 0 points 0 points 0 points 0 points 0 points  What time? 0 points 0 points 0 points 0 points 0 points  Count back from 20 0 points 0 points 0 points 0 points 0 points  Months in reverse 0 points 0 points 0 points 0 points 0 points  Repeat phrase 2 points 0 points 2 points 0 points 2 points  Total Score 2 points 0 points 2 points 0 points 2 points  Immunizations Immunization History  Administered Date(s) Administered   PFIZER(Purple Top)SARS-COV-2 Vaccination 05/26/2019, 06/16/2019   Tdap 06/13/2021    TDAP status: Up to date  Flu Vaccine status: Declined, Education has been provided regarding the importance of this vaccine but patient still declined. Advised may receive this vaccine at local pharmacy or Health Dept. Aware to provide a copy of the vaccination  record if obtained from local pharmacy or Health Dept. Verbalized acceptance and understanding.  Pneumococcal vaccine status: Declined,  Education has been provided regarding the importance of this vaccine but patient still declined. Advised may receive this vaccine at local pharmacy or Health Dept. Aware to provide a copy of the vaccination record if obtained from local pharmacy or Health Dept. Verbalized acceptance and understanding.   Covid-19 vaccine status: Completed vaccines  Qualifies for Shingles Vaccine? Yes   Zostavax completed No   Shingrix Completed?: No.    Education has been provided regarding the importance of this vaccine. Patient has been advised to call insurance company to determine out of pocket expense if they have not yet received this vaccine. Advised may also receive vaccine at local pharmacy or Health Dept. Verbalized acceptance and understanding.  Screening Tests Health Maintenance  Topic Date Due   Zoster Vaccines- Shingrix (1 of 2) Never done   Pneumonia Vaccine 53+ Years old (1 - PCV) Never done   COVID-19 Vaccine (3 - Pfizer risk series) 07/14/2019   INFLUENZA VACCINE  07/30/2022 (Originally 11/29/2021)   Medicare Annual Wellness (AWV)  06/02/2023   DTaP/Tdap/Td (2 - Td or Tdap) 06/14/2031   HPV VACCINES  Aged Out    Health Maintenance  Health Maintenance Due  Topic Date Due   Zoster Vaccines- Shingrix (1 of 2) Never done   Pneumonia Vaccine 103+ Years old (1 - PCV) Never done   COVID-19 Vaccine (3 - Pfizer risk series) 07/14/2019    Colorectal cancer screening: No longer required.   Lung Cancer Screening: (Low Dose CT Chest recommended if Age 33-80 years, 30 pack-year currently smoking OR have quit w/in 15years.) does not qualify.   Lung Cancer Screening Referral: declined referral  Additional Screening:  Hepatitis C Screening: does not qualify; Completed no  Vision Screening: Recommended annual ophthalmology exams for early detection of glaucoma  and other disorders of the eye. Is the patient up to date with their annual eye exam?  No  Who is the provider or what is the name of the office in which the patient attends annual eye exams? No one If pt is not established with a provider, would they like to be referred to a provider to establish care? No .   Dental Screening: Recommended annual dental exams for proper oral hygiene  Community Resource Referral / Chronic Care Management: CRR required this visit?  No   CCM required this visit?  No      Plan:     I have personally reviewed and noted the following in the patient's chart:   Medical and social history Use of alcohol, tobacco or illicit drugs  Current medications and supplements including opioid prescriptions. Patient is not currently taking opioid prescriptions. Functional ability and status Nutritional status Physical activity Advanced directives List of other physicians Hospitalizations, surgeries, and ER visits in previous 12 months Vitals Screenings to include cognitive, depression, and falls Referrals and appointments  In addition, I have reviewed and discussed with patient certain preventive protocols, quality metrics, and best practice recommendations. A written personalized care plan for preventive services as well  as general preventive health recommendations were provided to patient.     Dionisio David, LPN   07/30/9377   Nurse Notes: none

## 2022-07-07 ENCOUNTER — Encounter: Payer: Self-pay | Admitting: Internal Medicine

## 2022-07-07 ENCOUNTER — Ambulatory Visit (INDEPENDENT_AMBULATORY_CARE_PROVIDER_SITE_OTHER): Payer: Medicare HMO | Admitting: Internal Medicine

## 2022-07-07 VITALS — BP 130/78 | HR 80 | Ht 69.0 in | Wt 167.0 lb

## 2022-07-07 DIAGNOSIS — R1030 Lower abdominal pain, unspecified: Secondary | ICD-10-CM

## 2022-07-07 DIAGNOSIS — I10 Essential (primary) hypertension: Secondary | ICD-10-CM

## 2022-07-07 DIAGNOSIS — E785 Hyperlipidemia, unspecified: Secondary | ICD-10-CM

## 2022-07-07 DIAGNOSIS — I350 Nonrheumatic aortic (valve) stenosis: Secondary | ICD-10-CM

## 2022-07-07 DIAGNOSIS — Z8673 Personal history of transient ischemic attack (TIA), and cerebral infarction without residual deficits: Secondary | ICD-10-CM | POA: Diagnosis not present

## 2022-07-07 DIAGNOSIS — I700 Atherosclerosis of aorta: Secondary | ICD-10-CM | POA: Diagnosis not present

## 2022-07-07 MED ORDER — CLOPIDOGREL BISULFATE 75 MG PO TABS: 75.0000 mg | ORAL_TABLET | Freq: Every day | ORAL | 1 refills | Status: DC

## 2022-07-07 MED ORDER — ATORVASTATIN CALCIUM 80 MG PO TABS: 80.0000 mg | ORAL_TABLET | Freq: Every day | ORAL | 1 refills | Status: DC

## 2022-07-07 MED ORDER — LISINOPRIL 20 MG PO TABS: 20.0000 mg | ORAL_TABLET | Freq: Every day | ORAL | 1 refills | Status: DC

## 2022-07-07 NOTE — Progress Notes (Signed)
Date:  07/07/2022   Name:  [name]   DOB:  February 28, 1936   MRN:  [id number]   Chief Complaint: Hypertension  Hypertension This is a chronic problem. The problem is controlled. Pertinent negatives include no chest pain, headaches, palpitations or shortness of breath. Past treatments include ACE inhibitors. The current treatment provides significant improvement.  Hyperlipidemia This is a chronic problem. The problem is controlled. Pertinent negatives include no chest pain or shortness of breath. Current antihyperlipidemic treatment includes statins.  Abdominal Pain This is a new problem. The current episode started today (episode lasting 90 minutes after a bowel movement.  He rested and symptoms resolved.). The problem has been resolved. The pain is located in the suprapubic region. The pain is mild. The quality of the pain is cramping. Pertinent negatives include no constipation, diarrhea or headaches.  Aortic stenosis - last evaluation was in 2021.  He does not see Cardiology regularly.  He denies chest pain, shortness of breath, DOE, PND or orthopnea.   Lab Results  Component Value Date   NA 141 01/12/2022   K 5.4 (H) 01/12/2022   CO2 22 01/12/2022   GLUCOSE 91 01/12/2022   BUN 15 01/12/2022   CREATININE 1.00 01/12/2022   CALCIUM 9.1 01/12/2022   EGFR 74 01/12/2022   GFRNONAA 82 01/07/2020   Lab Results  Component Value Date   CHOL 109 01/12/2022   HDL 52 01/12/2022   LDLCALC 44 01/12/2022   TRIG 54 01/12/2022   CHOLHDL 2.1 01/12/2022   Lab Results  Component Value Date   TSH 1.520 06/28/2017   No results found for: "HGBA1C" Lab Results  Component Value Date   WBC 5.1 01/12/2022   HGB 14.3 01/12/2022   HCT 42.7 01/12/2022   MCV 97 01/12/2022   PLT 159 01/12/2022   Lab Results  Component Value Date   ALT 16 01/12/2022   AST 18 01/12/2022   ALKPHOS 84 01/12/2022   BILITOT 0.4 01/12/2022   No results found for: "25OHVITD2", "25OHVITD3", "VD25OH"   Review of  Systems  Constitutional:  Negative for fatigue and unexpected weight change.  HENT:  Negative for nosebleeds.   Eyes:  Negative for visual disturbance.  Respiratory:  Negative for cough, chest tightness, shortness of breath and wheezing.   Cardiovascular:  Negative for chest pain, palpitations and leg swelling.  Gastrointestinal:  Positive for abdominal pain. Negative for constipation and diarrhea.  Neurological:  Negative for dizziness, weakness, light-headedness and headaches.    Patient Active Problem List   Diagnosis Date Noted   Closed fracture of first cervical vertebra ([place]) 06/14/2021   Traumatic subarachnoid hemorrhage ([place]) 06/14/2021   Fall (on)(from) sidewalk curb, initial encounter 06/13/2021   Bilateral carotid artery stenosis 04/28/2020   Moderate aortic valve stenosis 12/12/2016   Aortic atherosclerosis ([place]) 12/12/2016   Seasonal allergies 07/14/2016   Primary hypertension 07/14/2016   Personal history of prostate cancer 11/23/2014   Dyslipidemia 09/03/2014   Edema of extremities 09/03/2014   History of CVA (cerebrovascular accident) without residual deficits 03/31/2012    No Known Allergies  Past Surgical History:  Procedure Laterality Date   INGUINAL HERNIA REPAIR Left     Social History   Tobacco Use   Smoking status: Former    Packs/day: 0.50    Years: 50.00    Total pack years: 25.00    Types: Cigarettes    Quit date: 05/02/2011    Years since quitting: 11.1   Smokeless tobacco: Never   Tobacco  comments:    smoking cessation materials not required  Vaping Use   Vaping Use: Never used  Substance Use Topics   Alcohol use: No   Drug use: No     Medication list has been reviewed and updated.  Current Meds  Medication Sig   calcium-vitamin D (OSCAL WITH D) 500-5 MG-MCG tablet Take 1 tablet by mouth daily with breakfast.   vitamin B-12 (CYANOCOBALAMIN) 1000 MCG tablet Take 1,000 mcg by mouth 2 (two) times a week.   [DISCONTINUED] atorvastatin  (LIPITOR) 80 MG tablet Take 1 tablet (80 mg total) by mouth daily.   [DISCONTINUED] clopidogrel (PLAVIX) 75 MG tablet Take 1 tablet (75 mg total) by mouth daily.   [DISCONTINUED] lisinopril (ZESTRIL) 20 MG tablet Take 1 tablet (20 mg total) by mouth daily.       07/07/2022   10:59 AM 01/12/2022   10:05 AM 01/11/2021   10:25 AM 10/12/2020    9:03 AM  GAD 7 : Generalized Anxiety Score  Nervous, Anxious, on Edge 0 1 0 0  Control/stop worrying 0 1 0 0  Worry too much - different things 1 1 0 0  Trouble relaxing 1 1 0 0  Restless 1 1 0 0  Easily annoyed or irritable '1 1 1 '$ 0  Afraid - awful might happen 0 1 0 0  Total GAD 7 Score '4 7 1 '$ 0  Anxiety Difficulty Not difficult at all Somewhat difficult  Not difficult at all       07/07/2022   10:59 AM 06/01/2022    9:49 AM 01/12/2022   10:05 AM  Depression screen PHQ 2/9  Decreased Interest 0 0 0  Down, Depressed, Hopeless 0 0 0  PHQ - 2 Score 0 0 0  Altered sleeping 1 0 1  Tired, decreased energy 1 0 1  Change in appetite 0 0 0  Feeling bad or failure about yourself  0 0 0  Trouble concentrating 0 0 0  Moving slowly or fidgety/restless 1 0 2  Suicidal thoughts 0 0 0  PHQ-9 Score 3 0 4  Difficult doing work/chores Not difficult at all Not difficult at all Not difficult at all    BP Readings from Last 3 Encounters:  07/07/22 130/78  01/12/22 118/62  01/11/21 126/88    Physical Exam Vitals and nursing note reviewed.  Constitutional:      General: He is not in acute distress.    Appearance: He is well-developed.  HENT:     Head: Normocephalic and atraumatic.  Neck:     Vascular: No carotid bruit.  Cardiovascular:     Rate and Rhythm: Normal rate and regular rhythm.     Heart sounds: Murmur heard.     Systolic murmur is present with a grade of 3/6.  Pulmonary:     Effort: Pulmonary effort is normal. No respiratory distress.     Breath sounds: No wheezing or rhonchi.  Abdominal:     General: Abdomen is flat.     Palpations:  Abdomen is soft.     Tenderness: There is no abdominal tenderness. There is no guarding or rebound.  Musculoskeletal:     Cervical back: Normal range of motion.     Right lower leg: No edema.     Left lower leg: No edema.  Lymphadenopathy:     Cervical: No cervical adenopathy.  Skin:    General: Skin is warm and dry.     Findings: No rash.  Neurological:  General: No focal deficit present.     Mental Status: He is alert and oriented to person, place, and time.  Psychiatric:        Mood and Affect: Mood normal.        Behavior: Behavior normal.     Wt Readings from Last 3 Encounters:  07/07/22 167 lb (75.8 kg)  06/01/22 171 lb (77.6 kg)  01/12/22 171 lb (77.6 kg)    BP 130/78 (BP Location: Left Arm, Cuff Size: Large)   Pulse 80   Ht '5\' 9"'$  (1.753 m)   Wt 167 lb (75.8 kg)   SpO2 97%   BMI 24.66 kg/m   Assessment and Plan: Problem List Items Addressed This Visit       Cardiovascular and Mediastinum   Moderate aortic valve stenosis - Primary (Chronic)    Doing well without limitations to daily activities. Last ECHO mod-severe AS with EF 60% No PND or orthopenia, no DOE or edema No chest pain or shortness of breath Will continue to monitor for clinical progression      Relevant Medications   atorvastatin (LIPITOR) 80 MG tablet   lisinopril (ZESTRIL) 20 MG tablet   Primary hypertension (Chronic)    Clinically stable exam with well controlled BP on lisinopril. Tolerating medications without side effects. Pt to continue current regimen and low sodium diet.       Relevant Medications   atorvastatin (LIPITOR) 80 MG tablet   lisinopril (ZESTRIL) 20 MG tablet     Other   Dyslipidemia (Chronic)   Relevant Medications   atorvastatin (LIPITOR) 80 MG tablet   History of CVA (cerebrovascular accident) without residual deficits (Chronic)    No residual or recurrent deficits On Plavix, statin and doing well      Relevant Medications   clopidogrel (PLAVIX) 75 MG  tablet   Other Visit Diagnoses     Essential hypertension       Relevant Medications   atorvastatin (LIPITOR) 80 MG tablet   lisinopril (ZESTRIL) 20 MG tablet   Lower abdominal pain       transient discomfort this AM after a bowel movement Now completely resolved with normal exam No evaluation is needed unless symptoms recur        Partially dictated using Editor, commissioning. Any errors are unintentional.  Halina Maidens, MD [facility]  07/07/2022

## 2022-07-07 NOTE — Assessment & Plan Note (Addendum)
Doing well without limitations to daily activities. Last ECHO mod-severe AS with EF 60% No PND or orthopenia, no DOE or edema No chest pain or shortness of breath Will continue to monitor for clinical progression

## 2022-07-07 NOTE — Assessment & Plan Note (Signed)
Clinically stable exam with well controlled BP on lisinopril. Tolerating medications without side effects. Pt to continue current regimen and low sodium diet.  

## 2022-07-07 NOTE — Assessment & Plan Note (Signed)
No residual or recurrent deficits On Plavix, statin and doing well

## 2022-07-13 ENCOUNTER — Ambulatory Visit: Payer: Medicare HMO | Admitting: Internal Medicine

## 2022-12-11 ENCOUNTER — Other Ambulatory Visit: Payer: Self-pay | Admitting: Internal Medicine

## 2022-12-11 DIAGNOSIS — E785 Hyperlipidemia, unspecified: Secondary | ICD-10-CM

## 2022-12-11 DIAGNOSIS — Z8673 Personal history of transient ischemic attack (TIA), and cerebral infarction without residual deficits: Secondary | ICD-10-CM

## 2022-12-11 DIAGNOSIS — I10 Essential (primary) hypertension: Secondary | ICD-10-CM

## 2022-12-11 MED ORDER — ATORVASTATIN CALCIUM 80 MG PO TABS: 80.0000 mg | ORAL_TABLET | Freq: Every day | ORAL | 1 refills | Status: DC

## 2022-12-11 MED ORDER — CLOPIDOGREL BISULFATE 75 MG PO TABS: 75.0000 mg | ORAL_TABLET | Freq: Every day | ORAL | 1 refills | Status: DC

## 2022-12-11 MED ORDER — LISINOPRIL 20 MG PO TABS: 20.0000 mg | ORAL_TABLET | Freq: Every day | ORAL | 1 refills | Status: DC

## 2023-02-02 ENCOUNTER — Encounter: Payer: Medicare HMO | Admitting: Internal Medicine

## 2023-02-05 ENCOUNTER — Encounter: Payer: Self-pay | Admitting: Internal Medicine

## 2023-02-05 ENCOUNTER — Ambulatory Visit (INDEPENDENT_AMBULATORY_CARE_PROVIDER_SITE_OTHER): Payer: Medicare HMO | Admitting: Internal Medicine

## 2023-02-05 VITALS — BP 118/64 | HR 53 | Ht 69.0 in | Wt 164.0 lb

## 2023-02-05 DIAGNOSIS — Z8673 Personal history of transient ischemic attack (TIA), and cerebral infarction without residual deficits: Secondary | ICD-10-CM

## 2023-02-05 DIAGNOSIS — Z8546 Personal history of malignant neoplasm of prostate: Secondary | ICD-10-CM | POA: Diagnosis not present

## 2023-02-05 DIAGNOSIS — D6869 Other thrombophilia: Secondary | ICD-10-CM | POA: Insufficient documentation

## 2023-02-05 DIAGNOSIS — I10 Essential (primary) hypertension: Secondary | ICD-10-CM

## 2023-02-05 DIAGNOSIS — Z0000 Encounter for general adult medical examination without abnormal findings: Secondary | ICD-10-CM | POA: Diagnosis not present

## 2023-02-05 DIAGNOSIS — E785 Hyperlipidemia, unspecified: Secondary | ICD-10-CM

## 2023-02-05 DIAGNOSIS — R600 Localized edema: Secondary | ICD-10-CM

## 2023-02-05 DIAGNOSIS — I350 Nonrheumatic aortic (valve) stenosis: Secondary | ICD-10-CM

## 2023-02-05 LAB — POCT URINALYSIS DIPSTICK
Bilirubin, UA: NEGATIVE
Blood, UA: NEGATIVE
Glucose, UA: NEGATIVE
Ketones, UA: NEGATIVE
Leukocytes, UA: NEGATIVE
Nitrite, UA: NEGATIVE
Protein, UA: NEGATIVE
Spec Grav, UA: 1.025 (ref 1.010–1.025)
Urobilinogen, UA: 0.2 U/dL
pH, UA: 6 (ref 5.0–8.0)

## 2023-02-05 NOTE — Assessment & Plan Note (Signed)
Normal exam with stable BP on lisinopril. No concerns or side effects to current medication. No change in regimen; continue low sodium diet.

## 2023-02-05 NOTE — Assessment & Plan Note (Signed)
2+ pitting edema both legs unchanged Encourage elevation and continued limitation of sodium intake

## 2023-02-05 NOTE — Progress Notes (Signed)
Date:  02/05/2023   Name:  [name]   DOB:  1935-12-19   MRN:  [id number]   Chief Complaint: Annual Exam [name] is a 87 y.o. male who presents today for his Complete Annual Exam. He feels well. He reports exercising - none. He reports he is sleeping fairly well.   Colonoscopy: aged out  Immunization History  Administered Date(s) Administered   PFIZER(Purple Top)SARS-COV-2 Vaccination 05/26/2019, 06/16/2019   Tdap 06/13/2021   There are no preventive care reminders to display for this patient.   Lab Results  Component Value Date   PSA1 <0.1 01/12/2022   PSA1 <0.1 01/11/2021   PSA1 <0.1 01/07/2020   PSA 0.05 05/11/2016   PSA 0.04 05/06/2015   PSA 0.09 10/29/2014   Hypertension This is a chronic problem. The problem is controlled. Past treatments include ACE inhibitors. The current treatment provides significant improvement. Hypertensive end-organ damage includes CVA. There is no history of kidney disease or CAD/MI.  Hyperlipidemia This is a chronic problem. The problem is controlled. Current antihyperlipidemic treatment includes statins.    Lab Results  Component Value Date   NA 141 01/12/2022   K 5.4 (H) 01/12/2022   CO2 22 01/12/2022   GLUCOSE 91 01/12/2022   BUN 15 01/12/2022   CREATININE 1.00 01/12/2022   CALCIUM 9.1 01/12/2022   EGFR 74 01/12/2022   GFRNONAA 82 01/07/2020   Lab Results  Component Value Date   CHOL 109 01/12/2022   HDL 52 01/12/2022   LDLCALC 44 01/12/2022   TRIG 54 01/12/2022   CHOLHDL 2.1 01/12/2022   Lab Results  Component Value Date   TSH 1.520 06/28/2017   No results found for: "HGBA1C" Lab Results  Component Value Date   WBC 5.1 01/12/2022   HGB 14.3 01/12/2022   HCT 42.7 01/12/2022   MCV 97 01/12/2022   PLT 159 01/12/2022   Lab Results  Component Value Date   ALT 16 01/12/2022   AST 18 01/12/2022   ALKPHOS 84 01/12/2022   BILITOT 0.4 01/12/2022   No results found for: "25OHVITD2", "25OHVITD3", "VD25OH"    Patient Active Problem List   Diagnosis Date Noted   Acquired thrombophilia (HCC) 02/05/2023   Bilateral carotid artery stenosis 04/28/2020   Moderate aortic valve stenosis 12/12/2016   Aortic atherosclerosis (HCC) 12/12/2016   Seasonal allergies 07/14/2016   Primary hypertension 07/14/2016   Personal history of prostate cancer 11/23/2014   Dyslipidemia 09/03/2014   Edema of extremities 09/03/2014   History of CVA (cerebrovascular accident) without residual deficits 03/31/2012    No Known Allergies  Past Surgical History:  Procedure Laterality Date   INGUINAL HERNIA REPAIR Left     Social History   Tobacco Use   Smoking status: Former    Current packs/day: 0.00    Average packs/day: 0.5 packs/day for 50.0 years (25.0 ttl pk-yrs)    Types: Cigarettes    Start date: 05/01/1961    Quit date: 05/02/2011    Years since quitting: 11.7   Smokeless tobacco: Never   Tobacco comments:    smoking cessation materials not required  Vaping Use   Vaping status: Never Used  Substance Use Topics   Alcohol use: No   Drug use: No     Medication list has been reviewed and updated.  Current Meds  Medication Sig   atorvastatin (LIPITOR) 80 MG tablet Take 1 tablet (80 mg total) by mouth daily.   calcium-vitamin D (OSCAL WITH D) 500-5 MG-MCG tablet Take 1  tablet by mouth daily with breakfast.   clopidogrel (PLAVIX) 75 MG tablet Take 1 tablet (75 mg total) by mouth daily.   lisinopril (ZESTRIL) 20 MG tablet Take 1 tablet (20 mg total) by mouth daily.   vitamin B-12 (CYANOCOBALAMIN) 1000 MCG tablet Take 1,000 mcg by mouth 2 (two) times a week.       02/05/2023    9:04 AM 07/07/2022   10:59 AM 01/12/2022   10:05 AM 01/11/2021   10:25 AM  GAD 7 : Generalized Anxiety Score  Nervous, Anxious, on Edge 0 0 1 0  Control/stop worrying 0 0 1 0  Worry too much - different things 0 1 1 0  Trouble relaxing 0 1 1 0  Restless 0 1 1 0  Easily annoyed or irritable 0 1 1 1   Afraid - awful might  happen 0 0 1 0  Total GAD 7 Score 0 4 7 1   Anxiety Difficulty Not difficult at all Not difficult at all Somewhat difficult        02/05/2023    9:04 AM 07/07/2022   10:59 AM 06/01/2022    9:49 AM  Depression screen PHQ 2/9  Decreased Interest 0 0 0  Down, Depressed, Hopeless 0 0 0  PHQ - 2 Score 0 0 0  Altered sleeping 2 1 0  Tired, decreased energy 1 1 0  Change in appetite 1 0 0  Feeling bad or failure about yourself  0 0 0  Trouble concentrating 1 0 0  Moving slowly or fidgety/restless 3 1 0  Suicidal thoughts 0 0 0  PHQ-9 Score 8 3 0  Difficult doing work/chores Not difficult at all Not difficult at all Not difficult at all    BP Readings from Last 3 Encounters:  02/05/23 118/64  07/07/22 130/78  01/12/22 118/62    Physical Exam Vitals and nursing note reviewed.  Constitutional:      Appearance: Normal appearance. He is well-developed.  HENT:     Head: Normocephalic.     Right Ear: Tympanic membrane, ear canal and external ear normal.     Left Ear: Tympanic membrane, ear canal and external ear normal.     Nose: Nose normal.  Eyes:     Conjunctiva/sclera: Conjunctivae normal.     Pupils: Pupils are equal, round, and reactive to light.  Neck:     Thyroid: No thyromegaly.     Vascular: No carotid bruit.  Cardiovascular:     Rate and Rhythm: Normal rate and regular rhythm. No extrasystoles are present.    Pulses:          Carotid pulses are 1+ on the right side and 1+ on the left side.    Heart sounds: Murmur heard.     Systolic murmur is present with a grade of 3/6.     No S3 sounds.     Comments: Edema chronic Pulmonary:     Effort: Pulmonary effort is normal.     Breath sounds: Normal breath sounds. No wheezing.  Chest:  Breasts:    Right: No mass.     Left: No mass.  Abdominal:     General: Bowel sounds are normal.     Palpations: Abdomen is soft.     Tenderness: There is no abdominal tenderness.  Musculoskeletal:        General: Normal range of  motion.     Cervical back: Normal range of motion and neck supple.     Right lower leg: 1+  Pitting Edema present.     Left lower leg: 1+ Pitting Edema present.  Lymphadenopathy:     Cervical: No cervical adenopathy.  Skin:    General: Skin is warm and dry.  Neurological:     Mental Status: He is alert and oriented to person, place, and time.     Deep Tendon Reflexes: Reflexes are normal and symmetric.  Psychiatric:        Attention and Perception: Attention normal.        Mood and Affect: Mood normal.        Thought Content: Thought content normal.    Lab Results  Component Value Date   COLORU yellow 02/05/2023   CLARITYU clear 02/05/2023   GLUCOSEUR Negative 02/05/2023   BILIRUBINUR neg 02/05/2023   KETONESU neg 02/05/2023   SPECGRAV 1.025 02/05/2023   RBCUR neg 02/05/2023   PHUR 6.0 02/05/2023   PROTEINUR Negative 02/05/2023   UROBILINOGEN 0.2 02/05/2023   LEUKOCYTESUR Negative 02/05/2023     Wt Readings from Last 3 Encounters:  02/05/23 164 lb (74.4 kg)  07/07/22 167 lb (75.8 kg)  06/01/22 171 lb (77.6 kg)    BP 118/64 (BP Location: Right Arm, Cuff Size: Normal)   Pulse (!) 53   Ht 5\' 9"  (1.753 m)   Wt 164 lb (74.4 kg)   SpO2 100%   BMI 24.22 kg/m   Assessment and Plan:  Problem List Items Addressed This Visit       Unprioritized   Acquired thrombophilia (HCC)   Dyslipidemia (Chronic)    LDL is  Lab Results  Component Value Date   LDLCALC 44 01/12/2022   Currently being treated with atorvastatin with good compliance and no concerns.       Relevant Orders   Lipid panel   Edema of extremities    2+ pitting edema both legs unchanged Encourage elevation and continued limitation of sodium intake      History of CVA (cerebrovascular accident) without residual deficits (Chronic)    No recurrent symptoms On statin and Plavix      Moderate aortic valve stenosis (Chronic)    Stable without PND or orthopnea; sleeps on one pillow Limited physical  activity without shortness of breath or chest pain Will continue to monitor      Personal history of prostate cancer (Chronic)    No longer being followed by Urology or Oncology      Relevant Orders   PSA   Primary hypertension (Chronic)    Normal exam with stable BP on lisinopril. No concerns or side effects to current medication. No change in regimen; continue low sodium diet.       Relevant Orders   CBC with Differential/Platelet   Comprehensive metabolic panel   POCT urinalysis dipstick (Completed)   Other Visit Diagnoses     Annual physical exam    -  Primary   continue low sodium diet Increase physical activity as tolerated He declines all vaccines       Return in about 6 months (around 08/06/2023) for HTN.    Reubin Milan, MD [facility] [place]

## 2023-02-05 NOTE — Assessment & Plan Note (Signed)
LDL is  Lab Results  Component Value Date   LDLCALC 44 01/12/2022   Currently being treated with atorvastatin with good compliance and no concerns.

## 2023-02-05 NOTE — Assessment & Plan Note (Signed)
Stable without PND or orthopnea; sleeps on one pillow Limited physical activity without shortness of breath or chest pain Will continue to monitor

## 2023-02-05 NOTE — Assessment & Plan Note (Signed)
No recurrent symptoms On statin and Plavix

## 2023-02-05 NOTE — Assessment & Plan Note (Signed)
No longer being followed by Urology or Oncology

## 2023-02-06 LAB — CBC WITH DIFFERENTIAL/PLATELET
Basophils Absolute: 0 10*3/uL (ref 0.0–0.2)
Basos: 1 %
EOS (ABSOLUTE): 0.1 10*3/uL (ref 0.0–0.4)
Eos: 3 %
Hematocrit: 41 % (ref 37.5–51.0)
Hemoglobin: 13.5 g/dL (ref 13.0–17.7)
Immature Grans (Abs): 0 10*3/uL (ref 0.0–0.1)
Immature Granulocytes: 0 %
Lymphocytes Absolute: 1 10*3/uL (ref 0.7–3.1)
Lymphs: 26 %
MCH: 33.3 pg — ABNORMAL HIGH (ref 26.6–33.0)
MCHC: 32.9 g/dL (ref 31.5–35.7)
MCV: 101 fL — ABNORMAL HIGH (ref 79–97)
Monocytes Absolute: 0.3 10*3/uL (ref 0.1–0.9)
Monocytes: 7 %
Neutrophils Absolute: 2.4 10*3/uL (ref 1.4–7.0)
Neutrophils: 63 %
Platelets: 148 10*3/uL — ABNORMAL LOW (ref 150–450)
RBC: 4.06 x10E6/uL — ABNORMAL LOW (ref 4.14–5.80)
RDW: 11.9 % (ref 11.6–15.4)
WBC: 3.8 10*3/uL (ref 3.4–10.8)

## 2023-02-06 LAB — COMPREHENSIVE METABOLIC PANEL
ALT: 20 [IU]/L (ref 0–44)
AST: 20 [IU]/L (ref 0–40)
Albumin: 3.6 g/dL — ABNORMAL LOW (ref 3.7–4.7)
Alkaline Phosphatase: 82 [IU]/L (ref 44–121)
BUN/Creatinine Ratio: 19 (ref 10–24)
BUN: 16 mg/dL (ref 8–27)
Bilirubin Total: 0.4 mg/dL (ref 0.0–1.2)
CO2: 21 mmol/L (ref 20–29)
Calcium: 9.3 mg/dL (ref 8.6–10.2)
Chloride: 109 mmol/L — ABNORMAL HIGH (ref 96–106)
Creatinine, Ser: 0.84 mg/dL (ref 0.76–1.27)
Globulin, Total: 2.3 g/dL (ref 1.5–4.5)
Glucose: 87 mg/dL (ref 70–99)
Potassium: 5.3 mmol/L — ABNORMAL HIGH (ref 3.5–5.2)
Sodium: 143 mmol/L (ref 134–144)
Total Protein: 5.9 g/dL — ABNORMAL LOW (ref 6.0–8.5)
eGFR: 85 mL/min/{1.73_m2} (ref >59)

## 2023-02-06 LAB — LIPID PANEL
Chol/HDL Ratio: 2.1 {ratio} (ref 0.0–5.0)
Cholesterol, Total: 108 mg/dL (ref 100–199)
HDL: 52 mg/dL (ref >39)
LDL Chol Calc (NIH): 44 mg/dL (ref 0–99)
Triglycerides: 52 mg/dL (ref 0–149)
VLDL Cholesterol Cal: 12 mg/dL (ref 5–40)

## 2023-02-06 LAB — PSA: Prostate Specific Ag, Serum: <0.1 ng/mL (ref 0.0–4.0)

## 2023-02-06 NOTE — Progress Notes (Signed)
PC to pt discussed labs, voiced understanding.

## 2023-06-06 ENCOUNTER — Ambulatory Visit (INDEPENDENT_AMBULATORY_CARE_PROVIDER_SITE_OTHER): Payer: Medicare HMO

## 2023-06-06 DIAGNOSIS — Z0000 Encounter for general adult medical examination without abnormal findings: Secondary | ICD-10-CM | POA: Diagnosis not present

## 2023-06-06 NOTE — Progress Notes (Signed)
 Subjective:   [name] is a 88 y.o. male who presents for Medicare Annual/Subsequent preventive examination.  Visit Complete: Virtual I connected with  Ryan Britain on 06/06/23 by a video and audio enabled telemedicine application and verified that I am speaking with the correct person using two identifiers.  Patient Location: Home  Provider Location: Office/Clinic  I discussed the limitations of evaluation and management by telemedicine. The patient expressed understanding and agreed to proceed.  Vital Signs: Because this visit was a virtual/telehealth visit, some criteria may be missing or patient reported. Any vitals not documented were not able to be obtained and vitals that have been documented are patient reported.  Cardiac Risk Factors include: advanced age (>30men, >85 women);dyslipidemia;hypertension;male gender     Objective:    There were no vitals filed for this visit. There is no height or weight on file to calculate BMI.     06/06/2023    1:13 PM 06/01/2022    9:51 AM 05/09/2021    9:31 AM 05/05/2020    9:48 AM 03/19/2019    3:28 PM 05/30/2018   10:00 AM 01/02/2018    2:54 PM  Advanced Directives  Does Patient Have a Medical Advance Directive? Yes Yes Yes Yes Yes No Yes  Type of Estate Agent of [place];Living will Healthcare Power of [place];Living will Healthcare Power of [place];Living will Healthcare Power of [place];Living will Healthcare Power of [place];Living will  Healthcare Power of [place];Living will  Does patient want to make changes to medical advance directive? No - Patient declined No - Patient declined       Copy of Healthcare Power of Attorney in Chart? Yes - validated most recent copy scanned in chart (See row information) Yes - validated most recent copy scanned in chart (See row information) Yes - validated most recent copy scanned in chart (See row information) Yes - validated most recent copy scanned in chart (See row  information) Yes - validated most recent copy scanned in chart (See row information)  Yes  Would patient like information on creating a medical advance directive?      No - Patient declined     Current Medications (verified) Outpatient Encounter Medications as of 06/06/2023  Medication Sig   atorvastatin  (LIPITOR) 80 MG tablet Take 1 tablet (80 mg total) by mouth daily.   calcium -vitamin D (OSCAL WITH D) 500-5 MG-MCG tablet Take 1 tablet by mouth daily with breakfast.   clopidogrel  (PLAVIX ) 75 MG tablet Take 1 tablet (75 mg total) by mouth daily.   lisinopril  (ZESTRIL ) 20 MG tablet Take 1 tablet (20 mg total) by mouth daily.   vitamin B-12 (CYANOCOBALAMIN) 1000 MCG tablet Take 1,000 mcg by mouth 2 (two) times a week.   No facility-administered encounter medications on file as of 06/06/2023.    Allergies (verified) Patient has no known allergies.   History: Past Medical History:  Diagnosis Date   Closed fracture of first cervical vertebra (HCC) 06/14/2021   Edema extremities    Elevated PSA    HLD (hyperlipidemia)    Hypertension    Kidney stones    Prostate cancer (HCC)    Stroke [facility])    Traumatic subarachnoid hemorrhage (HCC) 06/14/2021   Past Surgical History:  Procedure Laterality Date   INGUINAL HERNIA REPAIR Left    Family History  Problem Relation Age of Onset   Parkinson's disease Mother    Narcolepsy Mother    Stroke Brother    Prostate cancer Brother  Congestive Heart Failure Brother    Heart disease Brother    Alzheimer's disease Daughter    Kidney disease Neg Hx    Kidney cancer Neg Hx    Bladder Cancer Neg Hx    Social History   Socioeconomic History   Marital status: Married    Spouse name: Not on file   Number of children: 2   Years of education: Not on file   Highest education level: GED or equivalent  Occupational History   Occupation: Retired  Tobacco Use   Smoking status: Former    Current packs/day: 0.00    Average packs/day: 0.5  packs/day for 50.0 years (25.0 ttl pk-yrs)    Types: Cigarettes    Start date: 05/01/1961    Quit date: 05/02/2011    Years since quitting: 12.1   Smokeless tobacco: Never   Tobacco comments:    smoking cessation materials not required  Vaping Use   Vaping status: Never Used  Substance and Sexual Activity   Alcohol use: No   Drug use: No   Sexual activity: Not Currently    Birth control/protection: None  Other Topics Concern   Not on file  Social History Narrative   Not on file   Social Drivers of Health   Financial Resource Strain: Low Risk  (06/06/2023)   Overall Financial Resource Strain (CARDIA)    Difficulty of Paying Living Expenses: Not hard at all  Food Insecurity: No Food Insecurity (06/06/2023)   Hunger Vital Sign    Worried About Running Out of Food in the Last Year: Never true    Ran Out of Food in the Last Year: Never true  Transportation Needs: No Transportation Needs (06/06/2023)   PRAPARE - Administrator, Civil Service (Medical): No    Lack of Transportation (Non-Medical): No  Physical Activity: Insufficiently Active (06/06/2023)   Exercise Vital Sign    Days of Exercise per Week: 6 days    Minutes of Exercise per Session: 20 min  Stress: No Stress Concern Present (06/06/2023)   [employer] of Occupational Health - Occupational Stress Questionnaire    Feeling of Stress : Not at all  Social Connections: Moderately Integrated (06/06/2023)   Social Connection and Isolation Panel [NHANES]    Frequency of Communication with Friends and Family: Once a week    Frequency of Social Gatherings with Friends and Family: More than three times a week    Attends Religious Services: More than 4 times per year    Active Member of [employer] or Organizations: No    Attends Engineer, Structural: Never    Marital Status: Married    Tobacco Counseling Counseling given: Not Answered Tobacco comments: smoking cessation materials not required   Clinical  Intake:  Pre-visit preparation completed: Yes  Pain : No/denies pain     BMI - recorded: 24.2 Nutritional Status: BMI of 19-24  Normal Nutritional Risks: None Diabetes: No  How often do you need to have someone help you when you read instructions, pamphlets, or other written materials from your doctor or pharmacy?: 1 - Never  Interpreter Needed?: No  Information entered by :: JHONNIE DAS, LPN   Activities of Daily Living    06/06/2023    1:14 PM  In your present state of health, do you have any difficulty performing the following activities:  Hearing? 1  Vision? 0  Difficulty concentrating or making decisions? 0  Walking or climbing stairs? 0  Dressing or bathing? 0  Doing errands, shopping? 0  Preparing Food and eating ? N  Using the Toilet? N  In the past six months, have you accidently leaked urine? N  Do you have problems with loss of bowel control? N  Managing your Medications? N  Managing your Finances? N  Housekeeping or managing your Housekeeping? N    Patient Care Team: Justus Leita DEL, MD as PCP - General (Internal Medicine) Hester Wolm PARAS, MD as Consulting Physician (Cardiology)  Indicate any recent Medical Services you may have received from other than [facility] providers in the past year (date may be approximate).     Assessment:   This is a routine wellness examination for [place].  Hearing/Vision screen Hearing Screening - Comments:: NO AIDS Vision Screening - Comments:: READERS-   Goals Addressed             This Visit's Progress    Cut out extra servings         Depression Screen    06/06/2023    1:11 PM 02/05/2023    9:04 AM 07/07/2022   10:59 AM 06/01/2022    9:49 AM 01/12/2022   10:05 AM 05/09/2021    9:30 AM 01/11/2021   10:25 AM  PHQ 2/9 Scores  PHQ - 2 Score 0 0 0 0 0 0 0  PHQ- 9 Score 0 8 3 0 4  3    Fall Risk    06/06/2023    1:14 PM 02/05/2023    9:04 AM 07/07/2022   11:00 AM 06/01/2022    9:52 AM 01/12/2022   10:05 AM  Fall  Risk   Falls in the past year? 0 0 0 1 1  Number falls in past yr: 0 1 0 1 1  Injury with Fall? 0 0 0 1 0  Risk for fall due to : History of fall(s) History of fall(s) No Fall Risks History of fall(s) History of fall(s)  Follow up Falls prevention discussed;Falls evaluation completed Falls evaluation completed Falls evaluation completed Falls prevention discussed;Falls evaluation completed Falls evaluation completed    MEDICARE RISK AT HOME: Medicare Risk at Home Any stairs in or around the home?: Yes If so, are there any without handrails?: No Home free of loose throw rugs in walkways, pet beds, electrical cords, etc?: Yes Adequate lighting in your home to reduce risk of falls?: Yes Life alert?: No Use of a cane, walker or w/c?: No Grab bars in the bathroom?: Yes Shower chair or bench in shower?: No Elevated toilet seat or a handicapped toilet?: No  TIMED UP AND GO:  Was the test performed?  No    Cognitive Function:        06/06/2023    1:17 PM 06/01/2022    9:59 AM 06/01/2022    9:57 AM 01/12/2022   10:37 AM 01/02/2019    9:13 AM  6CIT Screen  What Year? 0 points 0 points 0 points 0 points 0 points  What month? 0 points 0 points 0 points 0 points 0 points  What time? 0 points 0 points 0 points 0 points 0 points  Count back from 20 0 points 0 points 0 points 0 points 0 points  Months in reverse 0 points 0 points 0 points 0 points 0 points  Repeat phrase 2 points 2 points 0 points 2 points 0 points  Total Score 2 points 2 points 0 points 2 points 0 points    Immunizations Immunization History  Administered Date(s) Administered   PFIZER(Purple  Top)SARS-COV-2 Vaccination 05/26/2019, 06/16/2019   Tdap 06/13/2021    TDAP status: Up to date  Flu Vaccine status: Declined, Education has been provided regarding the importance of this vaccine but patient still declined. Advised may receive this vaccine at local pharmacy or Health Dept. Aware to provide a copy of the vaccination  record if obtained from local pharmacy or Health Dept. Verbalized acceptance and understanding.  Pneumococcal vaccine status: Declined,  Education has been provided regarding the importance of this vaccine but patient still declined. Advised may receive this vaccine at local pharmacy or Health Dept. Aware to provide a copy of the vaccination record if obtained from local pharmacy or Health Dept. Verbalized acceptance and understanding.   Covid-19 vaccine status: Completed vaccines  Qualifies for Shingles Vaccine? No   Zostavax completed No   Shingrix Completed?: No.    Education has been provided regarding the importance of this vaccine. Patient has been advised to call insurance company to determine out of pocket expense if they have not yet received this vaccine. Advised may also receive vaccine at local pharmacy or Health Dept. Verbalized acceptance and understanding.  Screening Tests Health Maintenance  Topic Date Due   Zoster Vaccines- Shingrix (1 of 2) Never done   COVID-19 Vaccine (3 - Pfizer risk series) 07/14/2019   INFLUENZA VACCINE  07/30/2023 (Originally 11/30/2022)   Pneumonia Vaccine 71+ Years old (1 of 2 - PCV) 02/05/2024 (Originally 05/28/1941)   Medicare Annual Wellness (AWV)  06/05/2024   DTaP/Tdap/Td (2 - Td or Tdap) 06/14/2031   HPV VACCINES  Aged Out    Health Maintenance  Health Maintenance Due  Topic Date Due   Zoster Vaccines- Shingrix (1 of 2) Never done   COVID-19 Vaccine (3 - Pfizer risk series) 07/14/2019    Colorectal cancer screening: No longer required.   Lung Cancer Screening: (Low Dose CT Chest recommended if Age 70-80 years, 20 pack-year currently smoking OR have quit w/in 15years.) does not qualify.    Additional Screening:  Hepatitis C Screening: does not qualify; Completed NO  Vision Screening: Recommended annual ophthalmology exams for early detection of glaucoma and other disorders of the eye. Is the patient up to date with their annual eye  exam?  No  Who is the provider or what is the name of the office in which the patient attends annual eye exams?  NO ONE If pt is not established with a provider, would they like to be referred to a provider to establish care? No .   Dental Screening: Recommended annual dental exams for proper oral hygiene   Community Resource Referral / Chronic Care Management: CRR required this visit?  No   CCM required this visit?  No     Plan:     I have personally reviewed and noted the following in the patient's chart:   Medical and social history Use of alcohol, tobacco or illicit drugs  Current medications and supplements including opioid prescriptions. Patient is not currently taking opioid prescriptions. Functional ability and status Nutritional status Physical activity Advanced directives List of other physicians Hospitalizations, surgeries, and ER visits in previous 12 months Vitals Screenings to include cognitive, depression, and falls Referrals and appointments  In addition, I have reviewed and discussed with patient certain preventive protocols, quality metrics, and best practice recommendations. A written personalized care plan for preventive services as well as general preventive health recommendations were provided to patient.     Jhonnie GORMAN Das, LPN   11/01/7972   After Visit  Summary: (MyChart) Due to this being a telephonic visit, the after visit summary with patients personalized plan was offered to patient via MyChart   Nurse Notes: NONE

## 2023-06-06 NOTE — Patient Instructions (Addendum)
 Mr. [name] , Thank you for taking time to come for your Medicare Wellness Visit. I appreciate your ongoing commitment to your health goals. Please review the following plan we discussed and let me know if I can assist you in the future.   Referrals/Orders/Follow-Ups/Clinician Recommendations: NONE  This is a list of the screening recommended for you and due dates:  Health Maintenance  Topic Date Due   Zoster (Shingles) Vaccine (1 of 2) Never done   COVID-19 Vaccine (3 - Pfizer risk series) 07/14/2019   Flu Shot  07/30/2023*   Pneumonia Vaccine (1 of 2 - PCV) 02/05/2024*   Medicare Annual Wellness Visit  06/05/2024   DTaP/Tdap/Td vaccine (2 - Td or Tdap) 06/14/2031   HPV Vaccine  Aged Out  *Topic was postponed. The date shown is not the original due date.    Advanced directives: (In Chart) A copy of your advanced directives are scanned into your chart should your provider ever need it.  Next Medicare Annual Wellness Visit scheduled for next year: Yes   06/11/24 @ 11:30 AM BY VIDEO

## 2023-06-30 ENCOUNTER — Other Ambulatory Visit: Payer: Self-pay | Admitting: Internal Medicine

## 2023-06-30 DIAGNOSIS — Z8673 Personal history of transient ischemic attack (TIA), and cerebral infarction without residual deficits: Secondary | ICD-10-CM

## 2023-06-30 DIAGNOSIS — E785 Hyperlipidemia, unspecified: Secondary | ICD-10-CM

## 2023-06-30 DIAGNOSIS — I10 Essential (primary) hypertension: Secondary | ICD-10-CM

## 2023-07-02 NOTE — Telephone Encounter (Signed)
 Requested Prescriptions  Pending Prescriptions Disp Refills   atorvastatin (LIPITOR) 80 MG tablet [Pharmacy Med Name: ATORVASTATIN TAB 80MG ] 90 tablet 1    Sig: TAKE 1 TABLET DAILY     Cardiovascular:  Antilipid - Statins Failed - 07/02/2023  2:23 PM      Failed - Lipid Panel in normal range within the last 12 months    Cholesterol, Total  Date Value Ref Range Status  02/05/2023 108 100 - 199 mg/dL Final   Cholesterol  Date Value Ref Range Status  04/25/2012 162 0 - 200 mg/dL Final   Ldl Cholesterol, Calc  Date Value Ref Range Status  04/25/2012 113 (H) 0 - 100 mg/dL Final   LDL Chol Calc (NIH)  Date Value Ref Range Status  02/05/2023 44 0 - 99 mg/dL Final   HDL Cholesterol  Date Value Ref Range Status  04/25/2012 30 (L) 40 - 60 mg/dL Final   HDL  Date Value Ref Range Status  02/05/2023 52 >39 mg/dL Final   Triglycerides  Date Value Ref Range Status  02/05/2023 52 0 - 149 mg/dL Final  16/01/9603 95 0 - 200 mg/dL Final         Passed - Patient is not pregnant      Passed - Valid encounter within last 12 months    Recent Outpatient Visits           4 months ago Annual physical exam   [place] [facility] at [facility], Nyoka Cowden, MD   12 months ago Moderate aortic valve stenosis   [place] [facility] at [facility], Nyoka Cowden, MD   1 year ago Annual physical exam   [place] [facility] at [facility], Nyoka Cowden, MD   2 years ago Annual physical exam   [place] [facility] at [facility], Nyoka Cowden, MD   2 years ago Essential hypertension   [place] [facility] at [facility], Nyoka Cowden, MD       Future Appointments             In 1 week Judithann Graves Nyoka Cowden, MD [facility] at [facility], [facility]             clopidogrel (PLAVIX) 75 MG tablet [Pharmacy Med  Name: CLOPIDOGREL  TAB 75MG ] 90 tablet 1    Sig: TAKE 1 TABLET DAILY     Hematology: Antiplatelets - clopidogrel Failed - 07/02/2023  2:23 PM      Failed - PLT in normal range and within 180 days    Platelets  Date Value Ref Range Status  02/05/2023 148 (L) 150 - 450 x10E3/uL Final         Passed - HCT in normal range and within 180 days    Hematocrit  Date Value Ref Range Status  02/05/2023 41.0 37.5 - 51.0 % Final         Passed - HGB in normal range and within 180 days    Hemoglobin  Date Value Ref Range Status  02/05/2023 13.5 13.0 - 17.7 g/dL Final         Passed - Cr in normal range and within 360 days    Creatinine  Date Value Ref Range Status  04/25/2012 0.81 0.60 - 1.30 mg/dL Final   Creatinine, Ser  Date Value Ref Range Status  02/05/2023 0.84 0.76 - 1.27 mg/dL Final         Passed - Valid encounter within last 6 months    Recent Outpatient Visits           4 months ago Annual physical exam   [place] [facility] at [facility], Nyoka Cowden, MD   12 months ago Moderate aortic valve stenosis   [place] [facility] at [facility], Nyoka Cowden, MD   1 year ago Annual physical exam   [facility] at [facility], Nyoka Cowden, MD   2 years ago Annual physical exam   [facility] at [facility], Nyoka Cowden, MD   2 years ago Essential hypertension   [place] [facility] at [facility], Nyoka Cowden, MD       Future Appointments             In 1 week Judithann Graves Nyoka Cowden, MD [facility] at [facility], [facility]             lisinopril (ZESTRIL) 20 MG tablet [Pharmacy Med Name: LISINOPRIL TAB 20MG ] 90 tablet 1    Sig: TAKE 1 TABLET DAILY     Cardiovascular:  ACE Inhibitors Failed - 07/02/2023  2:23 PM      Failed - K in normal range and within 180 days     Potassium  Date Value Ref Range Status  02/05/2023 5.3 (H) 3.5 - 5.2 mmol/L Final  04/25/2012 4.4 3.5 - 5.1 mmol/L Final         Passed - Cr in normal range and within 180 days    Creatinine  Date Value Ref Range Status  04/25/2012 0.81 0.60 - 1.30 mg/dL Final   Creatinine, Ser  Date Value Ref Range Status  02/05/2023 0.84 0.76 - 1.27 mg/dL Final         Passed - Patient is not pregnant      Passed - Last BP in normal range    BP Readings from Last 1 Encounters:  02/05/23 118/64         Passed - Valid encounter within last 6 months    Recent Outpatient Visits           4 months ago Annual physical exam   [place] [facility] at [facility], Nyoka Cowden, MD   12 months ago Moderate aortic valve stenosis   [place] [facility] at [facility], Nyoka Cowden, MD   1 year ago Annual physical exam   [facility] at [facility], Nyoka Cowden, MD   2 years ago Annual physical exam   [facility] at [facility], Nyoka Cowden, MD   2 years ago Essential hypertension   [facility] at [facility], Nyoka Cowden, MD       Future Appointments             In 1 week Judithann Graves, Nyoka Cowden, MD [facility] at [facility], [facility]

## 2023-07-10 ENCOUNTER — Encounter: Payer: Self-pay | Admitting: Internal Medicine

## 2023-07-10 ENCOUNTER — Ambulatory Visit (INDEPENDENT_AMBULATORY_CARE_PROVIDER_SITE_OTHER): Payer: Medicare HMO | Admitting: Internal Medicine

## 2023-07-10 VITALS — BP 116/62 | HR 85 | Ht 69.0 in | Wt 153.2 lb

## 2023-07-10 DIAGNOSIS — I10 Essential (primary) hypertension: Secondary | ICD-10-CM

## 2023-07-10 DIAGNOSIS — I4510 Unspecified right bundle-branch block: Secondary | ICD-10-CM | POA: Diagnosis not present

## 2023-07-10 DIAGNOSIS — R001 Bradycardia, unspecified: Secondary | ICD-10-CM | POA: Diagnosis not present

## 2023-07-10 DIAGNOSIS — R1909 Other intra-abdominal and pelvic swelling, mass and lump: Secondary | ICD-10-CM | POA: Diagnosis not present

## 2023-07-10 NOTE — Assessment & Plan Note (Signed)
 Patient is unclear as to onset but becoming more uncomfortable He is not inclined for evaluation but I urged him and his wife to see Generally surgery for evaluation

## 2023-07-10 NOTE — Assessment & Plan Note (Addendum)
 Controlled BP with with frequent ectopy and RBBB. Current regimen is lisinopril. Will continue same medications; encourage continued reduced sodium diet. Recommend cardiology if symptoms worsen.

## 2023-07-10 NOTE — Progress Notes (Signed)
 Date:  07/10/2023   Name:  [name]   DOB:  [birth year]   MRN:  [id number]   Chief Complaint: Hypertension and Constipation  Hypertension This is a chronic problem. The problem is controlled. Pertinent negatives include no chest pain, headaches or shortness of breath. Past treatments include ACE inhibitors. There are no compliance problems.   Constipation This is a new problem. The current episode started yesterday. The patient is not on a high fiber diet. He Does not exercise regularly. There has Not been adequate water intake. Pertinent negatives include no fever. He has tried stool softeners for the symptoms. The treatment provided significant relief.  Groin Pain The patient's pertinent negatives include no scrotal swelling or testicular pain. This is a new problem. The current episode started more than 1 month ago. The problem occurs constantly. The problem has been gradually worsening. The pain is mild. Associated symptoms include constipation. Pertinent negatives include no chest pain, chills, fever, headaches or shortness of breath. Associated symptoms comments: Right groin mass.    Review of Systems  Constitutional:  Positive for unexpected weight change. Negative for chills, fatigue and fever.  Respiratory:  Negative for chest tightness and shortness of breath.   Cardiovascular:  Negative for chest pain.  Gastrointestinal:  Positive for constipation.  Genitourinary:  Negative for scrotal swelling and testicular pain.  Neurological:  Positive for weakness. Negative for dizziness and headaches.  Psychiatric/Behavioral:  Negative for dysphoric mood and sleep disturbance. The patient is not nervous/anxious.      Lab Results  Component Value Date   NA 143 02/05/2023   K 5.3 (H) 02/05/2023   CO2 21 02/05/2023   GLUCOSE 87 02/05/2023   BUN 16 02/05/2023   CREATININE 0.84 02/05/2023   CALCIUM 9.3 02/05/2023   EGFR 85 02/05/2023   GFRNONAA 82 01/07/2020   Lab Results   Component Value Date   CHOL 108 02/05/2023   HDL 52 02/05/2023   LDLCALC 44 02/05/2023   TRIG 52 02/05/2023   CHOLHDL 2.1 02/05/2023   Lab Results  Component Value Date   TSH 1.520 06/28/2017   No results found for: "HGBA1C" Lab Results  Component Value Date   WBC 3.8 02/05/2023   HGB 13.5 02/05/2023   HCT 41.0 02/05/2023   MCV 101 (H) 02/05/2023   PLT 148 (L) 02/05/2023   Lab Results  Component Value Date   ALT 20 02/05/2023   AST 20 02/05/2023   ALKPHOS 82 02/05/2023   BILITOT 0.4 02/05/2023   No results found for: "25OHVITD2", "25OHVITD3", "VD25OH"   Patient Active Problem List   Diagnosis Date Noted   Mass of right inguinal region 07/10/2023   New onset right bundle branch block (RBBB) 07/10/2023   Acquired thrombophilia (HCC) 02/05/2023   Bilateral carotid artery stenosis 04/28/2020   Moderate aortic valve stenosis 12/12/2016   Aortic atherosclerosis (HCC) 12/12/2016   Seasonal allergies 07/14/2016   Primary hypertension 07/14/2016   Personal history of prostate cancer 11/23/2014   Dyslipidemia 09/03/2014   Edema of extremities 09/03/2014   History of CVA (cerebrovascular accident) without residual deficits 03/31/2012    No Known Allergies  Past Surgical History:  Procedure Laterality Date   INGUINAL HERNIA REPAIR Left     Social History   Tobacco Use   Smoking status: Former    Current packs/day: 0.00    Average packs/day: 0.5 packs/day for 50.0 years (25.0 ttl pk-yrs)    Types: Cigarettes    Start date: 05/01/1961  Quit date: 05/02/2011    Years since quitting: 12.1   Smokeless tobacco: Never   Tobacco comments:    smoking cessation materials not required  Vaping Use   Vaping status: Never Used  Substance Use Topics   Alcohol use: No   Drug use: No     Medication list has been reviewed and updated.  Current Meds  Medication Sig   atorvastatin (LIPITOR) 80 MG tablet TAKE 1 TABLET DAILY   calcium-vitamin D (OSCAL WITH D) 500-5 MG-MCG  tablet Take 1 tablet by mouth daily with breakfast.   clopidogrel (PLAVIX) 75 MG tablet TAKE 1 TABLET DAILY   lisinopril (ZESTRIL) 20 MG tablet TAKE 1 TABLET DAILY   vitamin B-12 (CYANOCOBALAMIN) 1000 MCG tablet Take 1,000 mcg by mouth 2 (two) times a week.       07/10/2023    9:45 AM 02/05/2023    9:04 AM 07/07/2022   10:59 AM 01/12/2022   10:05 AM  GAD 7 : Generalized Anxiety Score  Nervous, Anxious, on Edge 0 0 0 1  Control/stop worrying 0 0 0 1  Worry too much - different things 0 0 1 1  Trouble relaxing 0 0 1 1  Restless 0 0 1 1  Easily annoyed or irritable 0 0 1 1  Afraid - awful might happen 0 0 0 1  Total GAD 7 Score 0 0 4 7  Anxiety Difficulty Not difficult at all Not difficult at all Not difficult at all Somewhat difficult       07/10/2023    9:44 AM 06/06/2023    1:11 PM 02/05/2023    9:04 AM  Depression screen PHQ 2/9  Decreased Interest 0 0 0  Down, Depressed, Hopeless 0 0 0  PHQ - 2 Score 0 0 0  Altered sleeping 0 0 2  Tired, decreased energy 0 0 1  Change in appetite 1 0 1  Feeling bad or failure about yourself  0 0 0  Trouble concentrating 0 0 1  Moving slowly or fidgety/restless 0 0 3  Suicidal thoughts 0 0 0  PHQ-9 Score 1 0 8  Difficult doing work/chores Not difficult at all Not difficult at all Not difficult at all    BP Readings from Last 3 Encounters:  07/10/23 116/62  02/05/23 118/64  07/07/22 130/78    Physical Exam Constitutional:      Appearance: Normal appearance.  Cardiovascular:     Rate and Rhythm: Bradycardia present. Rhythm irregular. Extrasystoles are present.    Heart sounds: Normal heart sounds.  Pulmonary:     Effort: Pulmonary effort is normal.     Breath sounds: Normal breath sounds.  Abdominal:     General: Abdomen is flat.     Palpations: Abdomen is soft.  Genitourinary:    Testes:        Right: Mass present.       Comments: Grapefruit sized firm, minimally tender mass, not reducible Musculoskeletal:     Cervical  back: Normal range of motion.     Right lower leg: No edema.     Left lower leg: No edema.  Lymphadenopathy:     Cervical: No cervical adenopathy.  Neurological:     Mental Status: He is alert.     Wt Readings from Last 3 Encounters:  07/10/23 153 lb 4 oz (69.5 kg)  02/05/23 164 lb (74.4 kg)  07/07/22 167 lb (75.8 kg)    BP 116/62   Pulse 85   Ht 5\' 9"  (  1.753 m)   Wt 153 lb 4 oz (69.5 kg)   SpO2 97%   BMI 22.63 kg/m   Assessment and Plan:  Problem List Items Addressed This Visit       Unprioritized   Primary hypertension - Primary (Chronic)   Controlled BP with with frequent ectopy and RBBB. Current regimen is lisinopril. Will continue same medications; encourage continued reduced sodium diet. Recommend cardiology if symptoms worsen.       Mass of right inguinal region   Patient is unclear as to onset but becoming more uncomfortable He is not inclined for evaluation but I urged him and his wife to see Generally surgery for evaluation       Relevant Orders   Ambulatory referral to General Surgery   New onset right bundle branch block (RBBB)   New onset since 2013.   Complaints of fatigue and leg weakness Frequent ectopy on exam may be contributing Recommend cardiac evaluation but he adamantly refuses at this time      Other Visit Diagnoses       Bradycardia       EKG HR 87, frequent PACs,  RBBB new since 2013   Relevant Orders   EKG 12-Lead       No follow-ups on file.    Reubin Milan, MD [facility] [place]

## 2023-07-10 NOTE — Assessment & Plan Note (Signed)
 New onset since 2013.   Complaints of fatigue and leg weakness Frequent ectopy on exam may be contributing Recommend cardiac evaluation but he adamantly refuses at this time

## 2023-07-11 ENCOUNTER — Ambulatory Visit (INDEPENDENT_AMBULATORY_CARE_PROVIDER_SITE_OTHER): Admitting: Surgery

## 2023-07-11 ENCOUNTER — Encounter: Payer: Self-pay | Admitting: Surgery

## 2023-07-11 VITALS — BP 104/70 | HR 43 | Temp 98.2°F | Wt 150.0 lb

## 2023-07-11 DIAGNOSIS — K4030 Unilateral inguinal hernia, with obstruction, without gangrene, not specified as recurrent: Secondary | ICD-10-CM | POA: Diagnosis not present

## 2023-07-11 NOTE — Progress Notes (Signed)
 07/11/2023  Reason for Visit:  Right inguinal hernia  Requesting Provider:  Bari Edward, MD  History of Present Illness: [name] is a 88 y.o. male presenting for evaluation of a right inguinal hernia.  Patient reports about a month history of right groin discomfort and reports having had 2 episodes which lasted a few days.  Reports having issues with constipation and recently required over-the-counter Dulcolax.  Reports sometimes feeling more distended or full as if he were to have some mild nausea.  The bulging in the right groin is always out and does not go in on its own.  Denies any overlying skin changes and denies any issues in the left groin.  Of note, the patient has a history of aortic stenosis.  The patient was also told in the past that he likely needed a pacemaker but he refused.  He takes Plavix for carotid artery atherosclerosis.  Past Medical History: Past Medical History:  Diagnosis Date   Closed fracture of first cervical vertebra (HCC) 06/14/2021   Edema extremities    Elevated PSA    HLD (hyperlipidemia)    Hypertension    Kidney stones    Prostate cancer (HCC)    Stroke [facility])    Traumatic subarachnoid hemorrhage (HCC) 06/14/2021     Past Surgical History: Past Surgical History:  Procedure Laterality Date   INGUINAL HERNIA REPAIR Left 1981    Home Medications: Prior to Admission medications   Medication Sig Start Date End Date Taking? Authorizing Provider  atorvastatin (LIPITOR) 80 MG tablet TAKE 1 TABLET DAILY 07/02/23  Yes Reubin Milan, MD  calcium-vitamin D (OSCAL WITH D) 500-5 MG-MCG tablet Take 1 tablet by mouth daily with breakfast.   Yes [provider]  clopidogrel (PLAVIX) 75 MG tablet TAKE 1 TABLET DAILY 07/02/23  Yes Reubin Milan, MD  lisinopril (ZESTRIL) 20 MG tablet TAKE 1 TABLET DAILY 07/02/23  Yes Reubin Milan, MD  vitamin B-12 (CYANOCOBALAMIN) 1000 MCG tablet Take 1,000 mcg by mouth 2 (two) times a week.   Yes  [provider]    Allergies: No Known Allergies  Social History:  reports that he quit smoking about 12 years ago. His smoking use included cigarettes. He started smoking about 62 years ago. He has a 25 pack-year smoking history. He has been exposed to tobacco smoke. He has never used smokeless tobacco. He reports that he does not drink alcohol and does not use drugs.   Family History: Family History  Problem Relation Age of Onset   Parkinson's disease Mother    Narcolepsy Mother    Stroke Brother    Prostate cancer Brother    Congestive Heart Failure Brother    Heart disease Brother    Alzheimer's disease Daughter    Kidney disease Neg Hx    Kidney cancer Neg Hx    Bladder Cancer Neg Hx     Review of Systems: Review of Systems  Constitutional:  Negative for chills and fever.  HENT:  Negative for hearing loss.   Respiratory:  Negative for shortness of breath.   Cardiovascular:  Negative for chest pain.  Gastrointestinal:  Positive for abdominal pain (right groin), constipation and nausea. Negative for vomiting.  Genitourinary:  Negative for dysuria.  Musculoskeletal:  Positive for neck pain. Negative for myalgias.  Skin:  Negative for rash.  Neurological:  Negative for dizziness.  Psychiatric/Behavioral:  Negative for depression.     Physical Exam BP 104/70   Pulse (!) 43  Temp 98.2 F (36.8 C)   Wt 150 lb (68 kg)   SpO2 99%   BMI 22.15 kg/m  CONSTITUTIONAL: No acute distress, well nourished HEENT:  Normocephalic, atraumatic, extraocular motion intact, hard of hearing. NECK: Trachea is midline, and there is no jugular venous distension.  RESPIRATORY:  Lungs are clear, and breath sounds are equal bilaterally. Normal respiratory effort without pathologic use of accessory muscles. CARDIOVASCULAR: Patient has a systolic murmur consistent with his aortic valve stenosis.  Also has sinus bradycardia. GI: The abdomen is soft, non-distended, with tenderness in  the right groin with deep palpation.  He has an incarcerated right inguinal hernia containing bowel.  No overlying skin erythema and the contents are somewhat soft.  No specific evidence for strangulation at this point.  Left groin incision is well healed without hernia palpable.   MUSCULOSKELETAL:  Normal muscle strength and tone in all four extremities.  No peripheral edema or cyanosis. SKIN: Skin turgor is normal. There are no pathologic skin lesions.  NEUROLOGIC:  Motor and sensation is grossly normal.  Cranial nerves are grossly intact. PSYCH:  Alert and oriented to person, place and time. Affect is normal.  Laboratory Analysis: Labs from 02/05/2023: Sodium 143, potassium 5.3, chloride 109, CO2 21, BUN 16, creatinine 0.84.  Total bilirubin 0.4, AST 20, ALT 20, alkaline phosphatase 82, albumin 3.6.  WBC 3.8, hemoglobin 13.5, hematocrit 41, platelets 148.  Imaging: CT abdomen/pelvis on 10/12/2015: FINDINGS: Lower chest: Normal heart size. Dependent scarring and atelectasis within the lower lobes bilaterally. No pleural effusion.   Hepatobiliary: Liver is normal in size and contour. No focal lesion is identified. Gallbladder is unremarkable.   Pancreas: Unremarkable   Spleen: Unremarkable   Adrenals/Urinary Tract: There is a 2.2 cm left adrenal nodule with an internal density of -4 Hounsfield units (image 23; series 2) compatible with adenoma. Mild right adrenal gland thickening. Noncontrast images demonstrate no nephroureterolithiasis. No hydronephrosis. Urinary bladder is unremarkable. Kidneys enhance symmetrically with contrast. There is a 2.1 cm cyst within the interpolar region of the left kidney. Too small to characterize low-attenuation lesion within the inferior pole of the right kidney. Delayed phase images demonstrate excretion of contrast material into the bilateral renal collecting systems. The mid and distal ureters as well as urinary bladder are not included on delayed phase  images. No abnormal filling defects are identified within the renal collecting systems bilaterally.   Stomach/Bowel: No abnormal bowel wall thickening or evidence for bowel obstruction. Descending and sigmoid colonic diverticulosis without CT evidence for acute diverticulitis. Multiple surgical clips within the anterior abdomen. No evidence for bowel obstruction. No free fluid or free intraperitoneal air. Normal morphology of the stomach.   Vascular/Lymphatic: Normal caliber abdominal aorta. Peripheral calcified atherosclerotic plaque. No retroperitoneal lymphadenopathy.   Other: Small fat containing right inguinal hernia.   Musculoskeletal: Mildly displaced left sixth, seventh and ninth rib fractures. Lower lumbar spine degenerative changes. L5-S1 pars defects with grade 1 anterolisthesis at this level.   IMPRESSION: Acute mildly displaced left sixth, seventh and ninth rib fractures.   Otherwise no evidence for traumatic visceral injury within the abdomen or pelvis.  Assessment and Plan: This is a 88 y.o. male with an incarcerated right inguinal hernia containing bowel.  - Discussed with patient the findings on exam today that he has an incarcerated right inguinal hernia containing bowel.  He is symptomatic with deep palpation of the hernia contents when trying to reduce them.  He has had some episodes of more discomfort which  lasted a few days.  He reports that the hernia bulging size overall does not decrease even when he lies down.  Overall I am concerned that he is likely to become strangulated. - Discussed with patient my recommendation for an open right inguinal hernia repair and discussed with him the surgery at length including the planned incision, risks of bleeding, infection, injury to surrounding structures, activity restrictions, pain control.  The patient for now is not interested in having any surgery.  I strongly encouraged him to at least think about it for now given the  potential risks that he is running of a strangulated hernia.  Discussed with him that a strangulated hernia is an emergent surgery and given that his hernia contains bowel, there is a high likelihood that he would have ischemic or necrotic bowel requiring resection and if no surgery was done at that point, this could potentially progress to sepsis and death.  He will think about it. - In the meantime, I will tentatively schedule him for an open right inguinal hernia repair on 07/26/2023 so that we do not fall further behind in time if he does decide to proceed.  Will also send for urgent cardiology evaluation and clearance.  Discussed with him that he will also have to hold his Plavix for surgery with the last dose on 07/18/2023.  However all this is tentative and if the patient at the end truly decides not to have surgery, understanding the potential risks of not going through surgery, we will abide by his wishes and cancel everything. - The patient understands this plan and is in agreement with proceeding this way.  All of his questions have been answered.  He will call us with his decision.  I spent 55 minutes dedicated to the care of this patient on the date of this encounter to include pre-visit review of records, face-to-face time with the patient discussing diagnosis and management, and any post-visit coordination of care.   Howie Ill, MD [facility]

## 2023-07-11 NOTE — Patient Instructions (Addendum)
 We have sent a request for clearance to [facility] for clearance. Their number is [phone]. They will call you to schedule this appointment.   You will need to stop your Plavix 7 days before your surgery. Your last dose will be 07/18/23.  If you decide you do not want surgery you may call to cancel this any time.  You have chose to have your hernia repaired. This will be done by Dr. Aleen Campi at [facility].  Please see your (blue) Pre-care information that you have been given today. Our surgery scheduler will call you to verify surgery date and to go over information.   You will need to arrange to be out of work for approximately 1-2 weeks and then you may return with a lifting restriction for 4 more weeks. If you have FMLA or Disability paperwork that needs to be filled out, please have your company fax your paperwork to [phone] or you may drop this by either office. This paperwork will be filled out within 3 days after your surgery has been completed.  You may have a bruise in your groin and also swelling and brusing in your testicle area. You may use ice 4-5 times daily for 15-20 minutes each time. Make sure that you place a barrier between you and the ice pack. To decrease the swelling, you may roll up a bath towel and place it vertically in between your thighs with your testicles resting on the towel. You will want to keep this area elevated as much as possible for several days following surgery.    Inguinal Hernia, Adult Muscles help keep everything in the body in its proper place. But if a weak spot in the muscles develops, something can poke through. That is called a hernia. When this happens in the lower part of the belly (abdomen), it is called an inguinal hernia. (It takes its name from a part of the body in this region called the inguinal canal.) A weak spot in the wall of muscles lets some fat or part of the small intestine bulge through. An inguinal hernia can  develop at any age. Men get them more often than women. CAUSES  In adults, an inguinal hernia develops over time. It can be triggered by: Suddenly straining the muscles of the lower abdomen. Lifting heavy objects. Straining to have a bowel movement. Difficult bowel movements (constipation) can lead to this. Constant coughing. This may be caused by smoking or lung disease. Being overweight. Being pregnant. Working at a job that requires long periods of standing or heavy lifting. Having had an inguinal hernia before. One type can be an emergency situation. It is called a strangulated inguinal hernia. It develops if part of the small intestine slips through the weak spot and cannot get back into the abdomen. The blood supply can be cut off. If that happens, part of the intestine may die. This situation requires emergency surgery. SYMPTOMS  Often, a small inguinal hernia has no symptoms. It is found when a healthcare provider does a physical exam. Larger hernias usually have symptoms.  In adults, symptoms may include: A lump in the groin. This is easier to see when the person is standing. It might disappear when lying down. In men, a lump in the scrotum. Pain or burning in the groin. This occurs especially when lifting, straining or coughing. A dull ache or feeling of pressure in the groin. Signs of a strangulated hernia can include: A bulge in the groin that  becomes very painful and tender to the touch. A bulge that turns red or purple. Fever, nausea and vomiting. Inability to have a bowel movement or to pass gas. DIAGNOSIS  To decide if you have an inguinal hernia, a healthcare provider will probably do a physical examination. This will include asking questions about any symptoms you have noticed. The healthcare provider might feel the groin area and ask you to cough. If an inguinal hernia is felt, the healthcare provider may try to slide it back into the abdomen. Usually no other tests  are needed. TREATMENT  Treatments can vary. The size of the hernia makes a difference. Options include: Watchful waiting. This is often suggested if the hernia is small and you have had no symptoms. No medical procedure will be done unless symptoms develop. You will need to watch closely for symptoms. If any occur, contact your healthcare provider right away. Surgery. This is used if the hernia is larger or you have symptoms. Open surgery. This is usually an outpatient procedure (you will not stay overnight in a hospital). An cut (incision) is made through the skin in the groin. The hernia is put back inside the abdomen. The weak area in the muscles is then repaired by herniorrhaphy or hernioplasty. Herniorrhaphy: in this type of surgery, the weak muscles are sewn back together. Hernioplasty: a patch or mesh is used to close the weak area in the abdominal wall. Laparoscopy. In this procedure, a surgeon makes small incisions. A thin tube with a tiny video camera (called a laparoscope) is put into the abdomen. The surgeon repairs the hernia with mesh by looking with the video camera and using two long instruments. HOME CARE INSTRUCTIONS  After surgery to repair an inguinal hernia: You will need to take pain medicine prescribed by your healthcare provider. Follow all directions carefully. You will need to take care of the wound from the incision. Your activity will be restricted for awhile. This will probably include no heavy lifting for several weeks. You also should not do anything too active for a few weeks. When you can return to work will depend on the type of job that you have. During "watchful waiting" periods, you should: Maintain a healthy weight. Eat a diet high in fiber (fruits, vegetables and whole grains). Drink plenty of fluids to avoid constipation. This means drinking enough water and other liquids to keep your urine clear or pale yellow. Do not lift heavy objects. Do not stand for  long periods of time. Quit smoking. This should keep you from developing a frequent cough. SEEK MEDICAL CARE IF:  A bulge develops in your groin area. You feel pain, a burning sensation or pressure in the groin. This might be worse if you are lifting or straining. You develop a fever of more than 100.5 F (38.1 C). SEEK IMMEDIATE MEDICAL CARE IF:  Pain in the groin increases suddenly. A bulge in the groin gets bigger suddenly and does not go down. For men, there is sudden pain in the scrotum. Or, the size of the scrotum increases. A bulge in the groin area becomes red or purple and is painful to touch. You have nausea or vomiting that does not go away. You feel your heart beating much faster than normal. You cannot have a bowel movement or pass gas. You develop a fever of more than 102.0 F (38.9 C).   This information is not intended to replace advice given to you by your health care provider. Make sure  you discuss any questions you have with your health care provider.   Document Released: 09/03/2008 Document Revised: 07/10/2011 Document Reviewed: 10/19/2014 Elsevier Interactive Patient Education [employer]

## 2023-07-12 ENCOUNTER — Telehealth: Payer: Self-pay | Admitting: Surgery

## 2023-07-12 NOTE — Progress Notes (Signed)
 Request for Cardiology Clearance and to stop Plavix prior to surgery has been faxed to [facility].

## 2023-07-12 NOTE — Progress Notes (Signed)
 Request for Medical Clearance has been faxed to Dr Bari Edward.

## 2023-07-12 NOTE — Telephone Encounter (Signed)
 Spoke with wife, Britta Mccreedy.  They are informed of dates regarding surgery.  At this time she states they will call us back and let us know if they want to do the surgery as her husband is pending cardiac work up.    Patient has been advised of Pre-Admission date/time, and Surgery date at [facility].  Surgery Date: 07/26/23 Preadmission Testing Date: 07/18/23 (phone 1p-4p)  Patient has been made aware to call [phone], between 1-3:00pm the day before surgery, to find out what time to arrive for surgery.

## 2023-07-13 DIAGNOSIS — Z01818 Encounter for other preprocedural examination: Secondary | ICD-10-CM | POA: Diagnosis not present

## 2023-07-13 DIAGNOSIS — I10 Essential (primary) hypertension: Secondary | ICD-10-CM | POA: Diagnosis not present

## 2023-07-13 DIAGNOSIS — E782 Mixed hyperlipidemia: Secondary | ICD-10-CM | POA: Diagnosis not present

## 2023-07-13 DIAGNOSIS — I350 Nonrheumatic aortic (valve) stenosis: Secondary | ICD-10-CM | POA: Diagnosis not present

## 2023-07-13 NOTE — Progress Notes (Unsigned)
 Medical Clearance has been received from Dr Judithann Graves. The patient is cleared at low risk for surgery. He will need Cardiology clearance also.

## 2023-07-17 DIAGNOSIS — I350 Nonrheumatic aortic (valve) stenosis: Secondary | ICD-10-CM | POA: Diagnosis not present

## 2023-07-18 ENCOUNTER — Other Ambulatory Visit: Payer: Self-pay

## 2023-07-18 ENCOUNTER — Encounter
Admission: RE | Admit: 2023-07-18 | Discharge: 2023-07-18 | Disposition: A | Discharge status: Home / Self Care | Source: Ambulatory Visit | Attending: Surgery | Admitting: Surgery

## 2023-07-18 DIAGNOSIS — I10 Essential (primary) hypertension: Secondary | ICD-10-CM

## 2023-07-18 DIAGNOSIS — Z01812 Encounter for preprocedural laboratory examination: Secondary | ICD-10-CM

## 2023-07-18 DIAGNOSIS — I4510 Unspecified right bundle-branch block: Secondary | ICD-10-CM

## 2023-07-18 HISTORY — DX: Unilateral inguinal hernia, with obstruction, without gangrene, not specified as recurrent: K40.30

## 2023-07-18 HISTORY — DX: Cardiac arrhythmia, unspecified: I49.9

## 2023-07-18 HISTORY — DX: Personal history of urinary calculi: Z87.442

## 2023-07-18 NOTE — Patient Instructions (Addendum)
 Your procedure is scheduled on: 07/26/23 - Thursday Report to the Registration Desk on the 1st floor of the [facility]. To find out your arrival time, please call [phone] between 1PM - 3PM on: 07/25/23 - Wednesday If your arrival time is 6:00 am, do not arrive before that time as the [facility] entrance doors do not open until 6:00 am.  REMEMBER: Instructions that are not followed completely may result in serious medical risk, up to and including death; or upon the discretion of your surgeon and anesthesiologist your surgery may need to be rescheduled.  Do not eat food after midnight the night before surgery.  No gum chewing or hard candies.  You may however, drink CLEAR liquids up to 2 hours before you are scheduled to arrive for your surgery. Do not drink anything within 2 hours of your scheduled arrival time.  Clear liquids include: - water  - apple juice without pulp - gatorade (not RED colors) - black coffee or tea (Do NOT add milk or creamers to the coffee or tea) Do NOT drink anything that is not on this list.  One week prior to surgery: Stop Anti-inflammatories (NSAIDS) such as Advil, Aleve, Ibuprofen, Motrin, Naproxen, Naprosyn and Aspirin based products such as Excedrin, Goody's Powder, BC Powder. You may take Tylenol if needed for pain up until the day of surgery.  Stop ANY OVER THE COUNTER supplements until after surgery.  Last dose of Plavix on 07/18/2023 or per cardiology recommendations   ON THE DAY OF SURGERY ONLY TAKE THESE MEDICATIONS WITH SIPS OF WATER:  none  No Alcohol for 24 hours before or after surgery.  No Smoking including e-cigarettes for 24 hours before surgery.  No chewable tobacco products for at least 6 hours before surgery.  No nicotine patches on the day of surgery.  Do not use any "recreational" drugs for at least a week (preferably 2 weeks) before your surgery.  Please be advised that the combination of cocaine and anesthesia may have  negative outcomes, up to and including death. If you test positive for cocaine, your surgery will be cancelled.  On the morning of surgery brush your teeth with toothpaste and water, you may rinse your mouth with mouthwash if you wish. Do not swallow any toothpaste or mouthwash.  Use CHG Soap or wipes as directed on instruction sheet.  Do not wear jewelry, make-up, hairpins, clips or nail polish.  For welded (permanent) jewelry: bracelets, anklets, waist bands, etc.  Please have this removed prior to surgery.  If it is not removed, there is a chance that hospital personnel will need to cut it off on the day of surgery.  Do not wear lotions, powders, or perfumes.   Do not shave body hair from the neck down 48 hours before surgery.  Contact lenses, hearing aids and dentures may not be worn into surgery.  Do not bring valuables to the hospital. [facility] is not responsible for any missing/lost belongings or valuables.   Notify your doctor if there is any change in your medical condition (cold, fever, infection).  Wear comfortable clothing (specific to your surgery type) to the hospital.  After surgery, you can help prevent lung complications by doing breathing exercises.  Take deep breaths and cough every 1-2 hours. Your doctor may order a device called an Incentive Spirometer to help you take deep breaths. When coughing or sneezing, hold a pillow firmly against your incision with both hands. This is called "splinting." Doing this helps  protect your incision. It also decreases belly discomfort.  If you are being admitted to the hospital overnight, leave your suitcase in the car. After surgery it may be brought to your room.  In case of increased patient census, it may be necessary for you, the patient, to continue your postoperative care in the Same Day Surgery department.  If you are being discharged the day of surgery, you will not be allowed to drive home. You will need a  responsible individual to drive you home and stay with you for 24 hours after surgery.   If you are taking public transportation, you will need to have a responsible individual with you.  Please call the Pre-admissions Testing Dept. at [phone] if you have any questions about these instructions.  Surgery Visitation Policy:  Patients having surgery or a procedure may have two visitors.  Children under the age of 81 must have an adult with them who is not the patient.  Temporary Visitor Restrictions Due to increasing cases of flu, RSV and COVID-19: Children ages 82 and under will not be able to visit patients in [facility] hospitals under most circumstances.  Inpatient Visitation:    Visiting hours are 7 a.m. to 8 p.m. Up to four visitors are allowed at one time in a patient room. The visitors may rotate out with other people during the day.  One visitor age 19 or older may stay with the patient overnight and must be in the room by 8 p.m.    Preparing for Surgery with CHLORHEXIDINE GLUCONATE (CHG) Soap  Chlorhexidine Gluconate (CHG) Soap  o An antiseptic cleaner that kills germs and bonds with the skin to continue killing germs even after washing  o Used for showering the night before surgery and morning of surgery  Before surgery, you can play an important role by reducing the number of germs on your skin.  CHG (Chlorhexidine gluconate) soap is an antiseptic cleanser which kills germs and bonds with the skin to continue killing germs even after washing.  Please do not use if you have an allergy to CHG or antibacterial soaps. If your skin becomes reddened/irritated stop using the CHG.  1. Shower the NIGHT BEFORE SURGERY and the MORNING OF SURGERY with CHG soap.  2. If you choose to wash your hair, wash your hair first as usual with your normal shampoo.  3. After shampooing, rinse your hair and body thoroughly to remove the shampoo.  4. Use CHG as you would any other  liquid soap. You can apply CHG directly to the skin and wash gently with a scrungie or a clean washcloth.  5. Apply the CHG soap to your body only from the neck down. Do not use on open wounds or open sores. Avoid contact with your eyes, ears, mouth, and genitals (private parts). Wash face and genitals (private parts) with your normal soap.  6. Wash thoroughly, paying special attention to the area where your surgery will be performed.  7. Thoroughly rinse your body with warm water.  8. Do not shower/wash with your normal soap after using and rinsing off the CHG soap.  9. Pat yourself dry with a clean towel.  10. Wear clean pajamas to bed the night before surgery.  12. Place clean sheets on your bed the night of your first shower and do not sleep with pets.  13. Shower again with the CHG soap on the day of surgery prior to arriving at the hospital.  14. Do not  apply any deodorants/lotions/powders.  15. Please wear clean clothes to the hospital.

## 2023-07-19 ENCOUNTER — Encounter: Payer: Self-pay | Admitting: Urgent Care

## 2023-07-23 ENCOUNTER — Encounter: Payer: Self-pay | Admitting: Surgery

## 2023-07-23 ENCOUNTER — Encounter
Admission: RE | Admit: 2023-07-23 | Discharge: 2023-07-23 | Disposition: A | Discharge status: Home / Self Care | Source: Ambulatory Visit | Attending: Surgery | Admitting: Surgery

## 2023-07-23 DIAGNOSIS — Z01818 Encounter for other preprocedural examination: Secondary | ICD-10-CM | POA: Diagnosis not present

## 2023-07-23 DIAGNOSIS — R001 Bradycardia, unspecified: Secondary | ICD-10-CM | POA: Diagnosis not present

## 2023-07-23 DIAGNOSIS — I4510 Unspecified right bundle-branch block: Secondary | ICD-10-CM | POA: Diagnosis not present

## 2023-07-23 DIAGNOSIS — I10 Essential (primary) hypertension: Secondary | ICD-10-CM | POA: Insufficient documentation

## 2023-07-23 DIAGNOSIS — Z01812 Encounter for preprocedural laboratory examination: Secondary | ICD-10-CM

## 2023-07-23 LAB — BASIC METABOLIC PANEL
Anion gap: 4 — ABNORMAL LOW (ref 5–15)
BUN: 18 mg/dL (ref 8–23)
CO2: 25 mmol/L (ref 22–32)
Calcium: 8.8 mg/dL — ABNORMAL LOW (ref 8.9–10.3)
Chloride: 108 mmol/L (ref 98–111)
Creatinine, Ser: 0.97 mg/dL (ref 0.61–1.24)
GFR, Estimated: >60 mL/min (ref >60)
Glucose, Bld: 90 mg/dL (ref 70–99)
Potassium: 3.9 mmol/L (ref 3.5–5.1)
Sodium: 137 mmol/L (ref 135–145)

## 2023-07-23 LAB — CBC
HCT: 41.5 % (ref 39.0–52.0)
Hemoglobin: 14.2 g/dL (ref 13.0–17.0)
MCH: 33.6 pg (ref 26.0–34.0)
MCHC: 34.2 g/dL (ref 30.0–36.0)
MCV: 98.3 fL (ref 80.0–100.0)
Platelets: 177 10*3/uL (ref 150–400)
RBC: 4.22 MIL/uL (ref 4.22–5.81)
RDW: 12.8 % (ref 11.5–15.5)
WBC: 4.7 10*3/uL (ref 4.0–10.5)
nRBC: 0 % (ref 0.0–0.2)

## 2023-07-23 NOTE — Progress Notes (Unsigned)
 Cardiology clearance has been received from Dr Juliann Pares. The patient is cleared at Medium risk for surgery.

## 2023-07-23 NOTE — Progress Notes (Incomplete)
 Perioperative / Anesthesia Services  Pre-Admission Testing Clinical Review / Pre-Operative Anesthesia Consult  Date: 07/24/23  Patient Demographics:  Name: [name] DOB: [birth year] MRN:   [id number]  Planned Surgical Procedure(s):    Case: [id number] Date/Time: 07/26/23 0945   Procedure: REPAIR, HERNIA, INGUINAL, ADULT (Right) - Open   Anesthesia type: General   Diagnosis: Incarcerated right inguinal hernia [K40.30]   Pre-op diagnosis: Incarcerated right inguinal hernia   Location: [facility] OR ROOM 05 / [facility] FOR AN[facility]ES[facility]   Surgeons: Henrene Dodge, MD      NOTE: Available PAT nursing documentation and vital signs have been reviewed. Clinical nursing staff has updated patient's PMH/PSHx, current medication list, and drug allergies/intolerances to ensure comprehensive history available to assist in medical decision making as it pertains to the aforementioned surgical procedure and anticipated anesthetic course. Extensive review of available clinical information personally performed. [facility] and [facility] updated with any diagnoses/procedures that  may have been inadvertently omitted during his intake with the pre-admission testing department's nursing staff.  Clinical Discussion:  [name] is a 88 y.o. male who is submitted for pre-surgical anesthesia review and clearance prior to him undergoing the above procedure. Patient is a Current Smoker (25 pack years). Pertinent PMH includes: aortic stenosis, CVA x 2, RBBB, aortic atherosclerosis, HTN, HLD, peripheral edema, incarcerated RIGHT inguinal hernia, remote prostate cancer, cervical and lumbar DDD, nephrolithiasis.  Patient is followed by cardiology Melton Alar, MD). He was last seen in the cardiology clinic on 07/13/2023; notes reviewed. At the time of his clinic visit, patient doing well overall from a cardiovascular perspective. Patient denied any chest pain, shortness of breath, PND, orthopnea, palpitations, significant  peripheral edema, weakness, fatigue, vertiginous symptoms, or presyncope/syncope. Patient with a past medical history significant for cardiovascular diagnoses. Documented physical exam was grossly benign, providing no evidence of acute exacerbation and/or decompensation of the patient's known cardiovascular conditions.  CT imaging of the brain performed on 04/24/2012 revealed evidence of an old RIGHT cerebellar infarct in addition to an age undetermined LEFT thalamic lacunar infarct.  Subsequent MRI imaging performed on 04/25/2012 revealed punctate areas of acute ischemia in the BILATERAL cerebellar hemispheres.  MRA imaging demonstrated high-grade (severe) near occlusive mid basilar artery stenosis.  Most recent myocardial perfusion imaging study was performed on 04/06/2020 revealing a low normal left ventricular systolic function with an EF of 50%.  There were no regional wall motion abnormalities.  No artifact or left ventricular cavity size enlargement appreciated on review of imaging. SPECT images demonstrated no evidence of stress-induced myocardial ischemia or arrhythmia; no scintigraphic evidence of scar.  TID ratio = 0.84. Study determined to be normal and low risk.  Patient with a known history of aortic valve stenosis. Most recent TTE performed on 07/17/2023 revealed a normal left ventricular systolic function with an EF of>55%. There was severe LVH.  There were no regional wall motion abnormalities. Left ventricular diastolic Doppler parameters consistent with abnormal relaxation (G1DD). Right ventricular size and function normal. There was severe posterior mitral annular calcification. There was trivial tricuspid, mild mitral, and moderate aortic valve regurgitation. Patient with noted severe aortic valve stenosis with a mean transvalvular pressure gradient of 73 mmHg; AVA (VTI) =  0.39 cm2. This has progressed from previous assessment in 2021 at which time his mean trans-aortic valve gradient was  31.5 mmHg and AVA (VTI) = 0.82 cm2. All remaining transvalvular gradients were noted to be normal providing no evidence suggestive of valvular stenosis. Ascending aorta mildly dilated at 39  mm.   Due to patient's history of CVA x 2, patient is on daily antithrombotic therapy using clopidogrel.  He is reported to be compliant with therapy with no evidence or reports of GI/GU related bleeding blood pressure well controlled at 112/68 mmHg on currently prescribed ACEi (lisinopril) monotherapy.  Patient is on atorvastatin for his HLD diagnosis and ASCVD prevention. Patient is not diabetic. He does not have an OSAH diagnosis.  Functional capacity somewhat limited by patient's age and medical comorbidities, including his incarcerated inguinal hernia.  With that said, patient is able to complete his ADLs/ADLs without cardiovascular limitation.  Per the [facility], patient is able to exceed 4 METS of physical activity without experiencing any significant degrees of angina/anginal equivalent symptoms.  No changes were made to his medication regimen during his visit with cardiology.  Patient scheduled to follow-up with outpatient cardiology in 6 weeks or sooner if needed.  [name] is scheduled for an elective RIGHT INGUINAL HERNIA REPAIR on 07/26/2023 with Dr. Ernesto Rutherford, MD. Given patient's past medical history significant for cardiovascular diagnoses, presurgical cardiac clearance was sought by the PAT team.  Surgeons office has also requested clearance from patient's primary medical provider.  Specialty clearances were obtained as follows.  Per internal medicine, "patient is optimized for surgery from a medical perspective.  He is cleared to proceed at an overall LOW risk of significant perioperative complications.  Patient will also need clearance from cardiology".  Per cardiology, "this patient is optimized for surgery and may proceed with the planned procedural course with a MODERATE risk of significant  perioperative cardiovascular complications".  In review of the patient's chart, it is noted that he is on daily oral antithrombotic therapy. He has been instructed on recommendations for holding his clopidogrel for 7 days prior to his procedure with plans to restart as soon as postoperative bleeding risk felt to be minimized by his attending surgeon. The patient has been instructed that his last dose of clopidogrel should be on 07/18/2023.  Patient denies previous perioperative complications with anesthesia in the past. In review his EMR, there are no records available for review pertaining to any anesthetic courses within the [facility] system in the recent past.      07/11/2023    2:03 PM 07/10/2023    9:39 AM 02/05/2023    9:28 AM  Vitals with BMI  Height  5\' 9"    Weight 150 lbs 153 lbs 4 oz   BMI 22.14 22.62   Systolic 104 116 161  Diastolic 70 62 64  Pulse 43 85    Providers/Specialists:  NOTE: Primary physician provider listed below. Patient may have been seen by APP or partner within same practice.   PROVIDER ROLE / SPECIALTY LAST Eligha Bridegroom, MD  General Surgery (Surgeon) 07/11/2023  Reubin Milan, MD Primary Care Provider 07/10/2023  Clotilde Dieter, DO Cardiology 07/13/2023   Allergies:  No Known Allergies Current Home Medications:   No current facility-administered medications for this encounter.    atorvastatin (LIPITOR) 80 MG tablet   bisacodyl 5 MG EC tablet   cholecalciferol (VITAMIN D3) 25 MCG (1000 UNIT) tablet   clopidogrel (PLAVIX) 75 MG tablet   lisinopril (ZESTRIL) 20 MG tablet   vitamin B-12 (CYANOCOBALAMIN) 1000 MCG tablet   History:   Past Medical History:  Diagnosis Date   Adrenal nodule (LEFT)    Aortic atherosclerosis (HCC)    Aortic stenosis    Basilar artery stenosis    Cerebellar infarct (HCC)  Cerebrovascular accident (CVA) due to bilateral stenosis of cerebellar arteries (HCC) 04/24/2012   a.) CT head 04/24/2012: LEFT  thalamic lacunar infarct; old RIGHT cerebellar infarct; b.) MRI/MRA 04/25/2012: punctate areas of acute ischemia in the BILATERAL cerebellar hemispheres; high grade (severe) near occlusive stenosis of the mid-basilar artery   Closed fracture of first cervical vertebra (HCC) 06/14/2021   CVA (cerebral vascular accident) [facility])    a.) "old" RIGHT cerebellar infarct noted on head CT done on 04/24/2012   DDD (degenerative disc disease), cervical    DDD (degenerative disc disease), lumbar    Diverticulosis    Edema extremities    Elevated PSA    Gleason 7 (4+3) adenocarcinoma the prostate    HLD (hyperlipidemia)    Hypertension    Incarcerated right inguinal hernia    Kidney stones    L5-S1 pars defects with grade 1 anterolisthesis    Leukoaraiosis    Long term current use of clopidogrel    RBBB (right bundle branch block)    Renal cyst, left    Seasonal allergies    Traumatic subarachnoid hemorrhage (HCC) 06/14/2021   Past Surgical History:  Procedure Laterality Date   INGUINAL HERNIA REPAIR Left 1981   prostate procedure     Family History  Problem Relation Age of Onset   Parkinson's disease Mother    Narcolepsy Mother    Stroke Brother    Prostate cancer Brother    Congestive Heart Failure Brother    Heart disease Brother    Alzheimer's disease Daughter    Kidney disease Neg Hx    Kidney cancer Neg Hx    Bladder Cancer Neg Hx    Social History   Tobacco Use   Smoking status: Every Day    Current packs/day: 0.00    Average packs/day: 0.5 packs/day for 50.0 years (25.0 ttl pk-yrs)    Types: Cigarettes    Start date: 05/01/1961    Last attempt to quit: 05/02/2011    Years since quitting: 12.2    Passive exposure: Past   Smokeless tobacco: Never   Tobacco comments:    smoking cessation materials not required  Substance Use Topics   Alcohol use: No   Pertinent Clinical Results:  LABS:  Hospital Outpatient Visit on 07/23/2023  Component Date Value Ref Range Status    WBC 07/23/2023 4.7  4.0 - 10.5 K/uL Final   RBC 07/23/2023 4.22  4.22 - 5.81 MIL/uL Final   Hemoglobin 07/23/2023 14.2  13.0 - 17.0 g/dL Final   HCT 14/78/2956 41.5  39.0 - 52.0 % Final   MCV 07/23/2023 98.3  80.0 - 100.0 fL Final   MCH 07/23/2023 33.6  26.0 - 34.0 pg Final   MCHC 07/23/2023 34.2  30.0 - 36.0 g/dL Final   RDW 21/30/8657 12.8  11.5 - 15.5 % Final   Platelets 07/23/2023 177  150 - 400 K/uL Final   nRBC 07/23/2023 0.0  0.0 - 0.2 % Final   Performed at [facility], [address] [place], [place] [address]   Sodium 07/23/2023 137  135 - 145 mmol/L Final   Potassium 07/23/2023 3.9  3.5 - 5.1 mmol/L Final   Chloride 07/23/2023 108  98 - 111 mmol/L Final   CO2 07/23/2023 25  22 - 32 mmol/L Final   Glucose, Bld 07/23/2023 90  70 - 99 mg/dL Final   Glucose reference range applies only to samples taken after fasting for at least 8 hours.   BUN 07/23/2023 18  8 - 23 mg/dL Final   Creatinine, Ser 07/23/2023 0.97  0.61 - 1.24 mg/dL Final   Calcium 65/78/4696 8.8 (L)  8.9 - 10.3 mg/dL Final   GFR, Estimated 07/23/2023 >60  >60 mL/min Final   Comment: (NOTE) Calculated using the CKD-EPI Creatinine Equation (2021)    Anion gap 07/23/2023 4 (L)  5 - 15 Final   Performed at [facility], [address] [place], [place] [address]    ECG: Date: 07/23/2023  Time ECG obtained: 1429 PM Rate: 49 bpm Rhythm:  Sinus bradycardia with first-degree AV block; RBBB Axis (leads I and aVF): normal Intervals: PR 214 ms. QRS 150 ms. QTc 420 ms. ST segment and T wave changes: No evidence of acute T wave abnormalities or significant ST segment elevation or depression.  Evidence of a possible, age undetermined, prior infarct:  No Comparison: Similar to previous tracing obtained on 06/14/2021   IMAGING / PROCEDURES: TRANSTHORACIC ECHOCARDIOGRAM performed on 07/17/2023 Normal left ventricular systolic function with an EF of  >55% Severe LVH Left ventricular diastolic  Doppler parameters consistent with abnormal relaxation (G1DD). Normal right ventricular systolic function Severe posterior mitral annular calcification Trivial TR Mild MR Moderate AR Severe aortic stenosis with a mean pressure gradient of 73 mmHg; AVA (VTI) = 0.39 cm2.  All remaining transvalvular gradients normal with no evidence of valvular stenosis No pericardial effusion  MYOCARDIAL PERFUSION IMAGING STUDY (LEXISCAN) performed on 04/06/2020 Low normal left ventricular systolic function with EF of 50% No regional wall motion abnormalities Ventricular cavity size normal No artifact No evidence of stress-induced myocardial ischemia or arrhythmia; no scintigraphic evidence of scar TID ratio = 0.84 Study determined to be normal and low risk  CT ABDOMEN PELVIS W WO CONTRAST performed on 10/12/2015 Small fat containing right inguinal hernia.  Descending and sigmoid colonic diverticulosis without CT evidence for acute diverticulitis. There is a 2.2 cm left adrenal nodule . There is a 2.1 cm cyst within the interpolar region of the left kidney. Lower lumbar spine degenerative changes.  L5-S1 pars defects with grade 1 anterolisthesis at this level. Acute mildly displaced left sixth, seventh and ninth rib fractures. Otherwise no evidence for traumatic visceral injury within the abdomen or pelvis.  Impression and Plan:  [name] has been referred for pre-anesthesia review and clearance prior to him undergoing the planned anesthetic and procedural courses. Available labs, pertinent testing, and imaging results were personally reviewed by me in preparation for upcoming operative/procedural course. [facility] medical record has been updated following extensive record review and patient interview with PAT staff.   This patient has been appropriately cleared by cardiology (MODERATE) and from internal/family medicine (LOW) with the individually indicated risk of significant perioperative  complications. Despite clearances, there are significant reservations on my part regarding patient being appropriate from anesthesia care here at our facility.   Discussed patient with attending anesthesiologist on call Lorette Ang, MD). Specifically reviewed patient's progressive aortic valve stenosis, which is now severe. Given his severe valvular stenosis, patient is at increased risk for hemodynamic instability and increased myocardial oxygen demands that have the potential of resulting in rapid clinical deterioration and increased risk of major adverse cardiovascular events (MACE). In short, aortic stenosis creates a fixed LVOT obstruction, which in turn, limits the heart's ability to increase cardiac output. Increased filling pressure decrease overall coronary perfusion, thus making the patient more prone to cardiac ischemia. Several studies suggest that severe aortic stenosis is directly associated with increased morbidity and mortality rates following non-cardiac  surgery. After discussing the case with attending anesthesiologist, it was determined that patient would be better served in a tertiary care setting equipped with both the staff and physical resources needed to provide safe and effective care to this patient who has advanced cardiovascular needs. Patient will required advanced hemodynamic monitoring and components of involved in the provision of cardiac anesthesia that are not available to our anesthesia providers here at [facility].   Communicated directly with primary attending surgeon to ensure interdisciplinary communication between both surgery and anesthesia services. Decision from anesthesia was relayed to surgery. Requested that patient's case be transferred as previously mentioned. Additionally, I will send a copy of my note to the surgery and to patient's PCP to promote continuity of care and information sharing between the multiple service lines involved in the care of  this patient.   Jimmy Mulling, MSN, APRN, FNP-C, CEN [facility]  Perioperative Services Nurse Practitioner Phone: [phone] Fax: [phone] 07/24/23 9:24 AM  NOTE: This note has been prepared using Dragon dictation software. Despite my best ability to proofread, there is always the potential that unintentional transcriptional errors may still occur from this process.

## 2023-07-25 ENCOUNTER — Telehealth: Payer: Self-pay

## 2023-07-25 NOTE — Telephone Encounter (Signed)
 Spoke with the patient's spouse about his surgery cancellation. Dr Aleen Campi does think he still needs this surgery. I let her know that we have placed an Urgent referral to [facility] and provided her with their contact information.

## 2023-07-25 NOTE — Telephone Encounter (Signed)
-----   Message from Norina Buzzard sent at 07/25/2023  8:11 AM EDT ----- Regarding: RE: canceling surgery Surgery is cancelled. ----- Message ----- From: Henrene Dodge, MD Sent: 07/24/2023  11:29 AM EDT To: [facility] Pool; [facility] Clinical Pool Subject: canceling surgery                              Hi,  This patient is scheduled for right inguinal hernia repair on 3/27.  Quentin Mulling from preop messaged me that based on the patient's moderate/severe aortic stenosis, the anesthesia team does not feel comfortable doing his surgery at [facility].  They mention that he would need intraoperative monitoring that is not available at [facility].  As such, they recommend his surgery be done at a tertiary center.  Can y'all please inform the patient of this?  He'll need an urgent referral to [facility] or [facility] for his incarcerated right inguinal hernia in the setting of moderate/severe aortic stenosis.   His surgery with me on 3/27 can be canceled.  Thanks,  [employer]

## 2023-07-26 ENCOUNTER — Encounter: Admission: RE | Payer: Self-pay | Source: Home / Self Care

## 2023-07-26 ENCOUNTER — Ambulatory Visit: Admission: RE | Admit: 2023-07-26 | Source: Home / Self Care | Admitting: Surgery

## 2023-07-26 HISTORY — DX: Acute cerebrovascular insufficiency: I67.81

## 2023-07-26 HISTORY — DX: Other cervical disc degeneration, unspecified cervical region: M50.30

## 2023-07-26 HISTORY — DX: Occlusion and stenosis of basilar artery: I65.1

## 2023-07-26 HISTORY — DX: Diverticulosis of intestine, part unspecified, without perforation or abscess without bleeding: K57.90

## 2023-07-26 HISTORY — DX: Cyst of kidney, acquired: N28.1

## 2023-07-26 HISTORY — DX: Malignant neoplasm of prostate: C61

## 2023-07-26 HISTORY — DX: Unspecified right bundle-branch block: I45.10

## 2023-07-26 HISTORY — DX: Long term (current) use of antithrombotics/antiplatelets: Z79.02

## 2023-07-26 HISTORY — DX: Atherosclerosis of aorta: I70.0

## 2023-07-26 HISTORY — DX: Disorder of adrenal gland, unspecified: E27.9

## 2023-07-26 HISTORY — DX: Spondylolisthesis, site unspecified: M43.10

## 2023-07-26 HISTORY — DX: Nonrheumatic aortic (valve) stenosis: I35.0

## 2023-07-26 HISTORY — DX: Cerebral infarction, unspecified: I63.9

## 2023-07-26 HISTORY — DX: Other intervertebral disc degeneration, lumbar region without mention of lumbar back pain or lower extremity pain: M51.369

## 2023-07-26 HISTORY — DX: Other seasonal allergic rhinitis: J30.2

## 2023-07-26 SURGERY — REPAIR, HERNIA, INGUINAL, ADULT
Anesthesia: General | Laterality: Right

## 2023-07-31 DIAGNOSIS — I350 Nonrheumatic aortic (valve) stenosis: Secondary | ICD-10-CM | POA: Diagnosis not present

## 2023-07-31 DIAGNOSIS — I10 Essential (primary) hypertension: Secondary | ICD-10-CM | POA: Diagnosis not present

## 2023-07-31 DIAGNOSIS — E782 Mixed hyperlipidemia: Secondary | ICD-10-CM | POA: Diagnosis not present

## 2023-07-31 DIAGNOSIS — Z01818 Encounter for other preprocedural examination: Secondary | ICD-10-CM | POA: Diagnosis not present

## 2023-09-21 ENCOUNTER — Other Ambulatory Visit: Payer: Self-pay | Admitting: Internal Medicine

## 2023-09-21 DIAGNOSIS — I10 Essential (primary) hypertension: Secondary | ICD-10-CM

## 2023-09-21 DIAGNOSIS — Z8673 Personal history of transient ischemic attack (TIA), and cerebral infarction without residual deficits: Secondary | ICD-10-CM

## 2023-09-25 NOTE — Telephone Encounter (Signed)
 Too soon for refill, refilled 07/02/23 for 90 and 1 RF.  Requested Prescriptions  Pending Prescriptions Disp Refills   lisinopril  (ZESTRIL ) 20 MG tablet [Pharmacy Med Name: LISINOPRIL  TAB 20MG ] 90 tablet 0    Sig: TAKE 1 TABLET DAILY     Cardiovascular:  ACE Inhibitors Passed - 09/25/2023  9:46 AM      Passed - Cr in normal range and within 180 days    Creatinine  Date Value Ref Range Status  04/25/2012 0.81 0.60 - 1.30 mg/dL Final   Creatinine, Ser  Date Value Ref Range Status  07/23/2023 0.97 0.61 - 1.24 mg/dL Final         Passed - K in normal range and within 180 days    Potassium  Date Value Ref Range Status  07/23/2023 3.9 3.5 - 5.1 mmol/L Final  04/25/2012 4.4 3.5 - 5.1 mmol/L Final         Passed - Patient is not pregnant      Passed - Last BP in normal range    BP Readings from Last 1 Encounters:  07/11/23 104/70         Passed - Valid encounter within last 6 months    Recent Outpatient Visits           2 months ago Primary hypertension   [facility] at [facility], [place], MD               clopidogrel  (PLAVIX ) 75 MG tablet [Pharmacy Med Name: CLOPIDOGREL   TAB 75MG ] 90 tablet 0    Sig: TAKE 1 TABLET DAILY     Hematology: Antiplatelets - clopidogrel  Passed - 09/25/2023  9:46 AM      Passed - HCT in normal range and within 180 days    HCT  Date Value Ref Range Status  07/23/2023 41.5 39.0 - 52.0 % Final   Hematocrit  Date Value Ref Range Status  02/05/2023 41.0 37.5 - 51.0 % Final         Passed - HGB in normal range and within 180 days    Hemoglobin  Date Value Ref Range Status  07/23/2023 14.2 13.0 - 17.0 g/dL Final  40/98/1191 47.8 13.0 - 17.7 g/dL Final         Passed - PLT in normal range and within 180 days    Platelets  Date Value Ref Range Status  07/23/2023 177 150 - 400 K/uL Final  02/05/2023 148 (L) 150 - 450 x10E3/uL Final         Passed - Cr in normal range and within 360 days     Creatinine  Date Value Ref Range Status  04/25/2012 0.81 0.60 - 1.30 mg/dL Final   Creatinine, Ser  Date Value Ref Range Status  07/23/2023 0.97 0.61 - 1.24 mg/dL Final         Passed - Valid encounter within last 6 months    Recent Outpatient Visits           2 months ago Primary hypertension   [place] [facility] at [facility], [place], MD

## 2023-12-13 ENCOUNTER — Other Ambulatory Visit: Payer: Self-pay | Admitting: Internal Medicine

## 2023-12-13 DIAGNOSIS — E785 Hyperlipidemia, unspecified: Secondary | ICD-10-CM

## 2023-12-14 DIAGNOSIS — I350 Nonrheumatic aortic (valve) stenosis: Secondary | ICD-10-CM | POA: Diagnosis not present

## 2023-12-14 DIAGNOSIS — E782 Mixed hyperlipidemia: Secondary | ICD-10-CM | POA: Diagnosis not present

## 2023-12-14 DIAGNOSIS — Z1331 Encounter for screening for depression: Secondary | ICD-10-CM | POA: Diagnosis not present

## 2023-12-14 DIAGNOSIS — Z8673 Personal history of transient ischemic attack (TIA), and cerebral infarction without residual deficits: Secondary | ICD-10-CM | POA: Diagnosis not present

## 2023-12-17 ENCOUNTER — Other Ambulatory Visit: Payer: Self-pay

## 2023-12-17 DIAGNOSIS — E785 Hyperlipidemia, unspecified: Secondary | ICD-10-CM

## 2023-12-17 MED ORDER — ATORVASTATIN CALCIUM 80 MG PO TABS: 80.0000 mg | ORAL_TABLET | Freq: Every day | ORAL | 0 refills | Status: DC

## 2023-12-17 NOTE — Telephone Encounter (Signed)
 Requested medication (s) are due for refill today: yes  Requested medication (s) are on the active medication list: yes  Last refill:  07/02/23  Future visit scheduled: no  Notes to clinic:  Unable to refill per protocol due to failed labs, no updated lipid results.      Requested Prescriptions  Pending Prescriptions Disp Refills   atorvastatin  (LIPITOR) 80 MG tablet [Pharmacy Med Name: ATORVASTATIN  TAB 80MG ] 90 tablet 1    Sig: TAKE 1 TABLET DAILY     Cardiovascular:  Antilipid - Statins Failed - 12/17/2023  9:25 AM      Failed - Lipid Panel in normal range within the last 12 months    Cholesterol, Total  Date Value Ref Range Status  02/05/2023 108 100 - 199 mg/dL Final   Cholesterol  Date Value Ref Range Status  04/25/2012 162 0 - 200 mg/dL Final   Ldl Cholesterol, Calc  Date Value Ref Range Status  04/25/2012 113 (H) 0 - 100 mg/dL Final   LDL Chol Calc (NIH)  Date Value Ref Range Status  02/05/2023 44 0 - 99 mg/dL Final   HDL Cholesterol  Date Value Ref Range Status  04/25/2012 30 (L) 40 - 60 mg/dL Final   HDL  Date Value Ref Range Status  02/05/2023 52 >39 mg/dL Final   Triglycerides  Date Value Ref Range Status  02/05/2023 52 0 - 149 mg/dL Final  87/73/7986 95 0 - 200 mg/dL Final         Passed - Patient is not pregnant      Passed - Valid encounter within last 12 months    Recent Outpatient Visits           5 months ago Primary hypertension   [place] [facility] at [facility], [place], MD

## 2024-01-05 ENCOUNTER — Other Ambulatory Visit: Payer: Self-pay | Admitting: Internal Medicine

## 2024-01-05 DIAGNOSIS — E785 Hyperlipidemia, unspecified: Secondary | ICD-10-CM

## 2024-01-07 NOTE — Telephone Encounter (Signed)
 Requested medications are due for refill today. soon  Requested medications are on the active medications list.  yes  Last refill. 12/17/2023 #30 0 rf  Future visit scheduled.   no  Notes to clinic.  Pt already given a courtesy refill. Labs about to expire.    Requested Prescriptions  Pending Prescriptions Disp Refills   atorvastatin  (LIPITOR) 80 MG tablet [Pharmacy Med Name: ATORVASTATIN  TAB 80MG ] 30 tablet 0    Sig: TAKE 1 TABLET DAILY     Cardiovascular:  Antilipid - Statins Failed - 01/07/2024 11:01 AM      Failed - Lipid Panel in normal range within the last 12 months    Cholesterol, Total  Date Value Ref Range Status  02/05/2023 108 100 - 199 mg/dL Final   Cholesterol  Date Value Ref Range Status  04/25/2012 162 0 - 200 mg/dL Final   Ldl Cholesterol, Calc  Date Value Ref Range Status  04/25/2012 113 (H) 0 - 100 mg/dL Final   LDL Chol Calc (NIH)  Date Value Ref Range Status  02/05/2023 44 0 - 99 mg/dL Final   HDL Cholesterol  Date Value Ref Range Status  04/25/2012 30 (L) 40 - 60 mg/dL Final   HDL  Date Value Ref Range Status  02/05/2023 52 >39 mg/dL Final   Triglycerides  Date Value Ref Range Status  02/05/2023 52 0 - 149 mg/dL Final  87/73/7986 95 0 - 200 mg/dL Final         Passed - Patient is not pregnant      Passed - Valid encounter within last 12 months    Recent Outpatient Visits           6 months ago Primary hypertension   [place] [facility] at [facility], Leita DEL, MD

## 2024-02-26 ENCOUNTER — Other Ambulatory Visit: Payer: Self-pay | Admitting: Internal Medicine

## 2024-02-26 DIAGNOSIS — E785 Hyperlipidemia, unspecified: Secondary | ICD-10-CM

## 2024-02-27 NOTE — Telephone Encounter (Signed)
 Requested Prescriptions  Pending Prescriptions Disp Refills   atorvastatin  (LIPITOR) 80 MG tablet [Pharmacy Med Name: ATORVASTATIN  TAB 80MG ] 30 tablet 1    Sig: TAKE 1 TABLET DAILY     Cardiovascular:  Antilipid - Statins Failed - 02/27/2024  3:22 PM      Failed - Lipid Panel in normal range within the last 12 months    Cholesterol, Total  Date Value Ref Range Status  02/05/2023 108 100 - 199 mg/dL Final   Cholesterol  Date Value Ref Range Status  04/25/2012 162 0 - 200 mg/dL Final   Ldl Cholesterol, Calc  Date Value Ref Range Status  04/25/2012 113 (H) 0 - 100 mg/dL Final   LDL Chol Calc (NIH)  Date Value Ref Range Status  02/05/2023 44 0 - 99 mg/dL Final   HDL Cholesterol  Date Value Ref Range Status  04/25/2012 30 (L) 40 - 60 mg/dL Final   HDL  Date Value Ref Range Status  02/05/2023 52 >39 mg/dL Final   Triglycerides  Date Value Ref Range Status  02/05/2023 52 0 - 149 mg/dL Final  87/73/7986 95 0 - 200 mg/dL Final         Passed - Patient is not pregnant      Passed - Valid encounter within last 12 months    Recent Outpatient Visits           7 months ago Primary hypertension   [place] [facility] at [facility], Leita DEL, MD

## 2024-06-02 ENCOUNTER — Other Ambulatory Visit: Payer: Self-pay | Admitting: Internal Medicine

## 2024-06-02 DIAGNOSIS — E785 Hyperlipidemia, unspecified: Secondary | ICD-10-CM

## 2024-06-03 NOTE — Telephone Encounter (Signed)
 Requested medications are due for refill today.  yes  Requested medications are on the active medications list.  yes  Last refill. 10//29/2025 #30 1 rf  Future visit scheduled.   no  Notes to clinic.  Expired labs    Requested Prescriptions  Pending Prescriptions Disp Refills   atorvastatin  (LIPITOR) 80 MG tablet 30 tablet 1    Sig: Take 1 tablet (80 mg total) by mouth daily.     Cardiovascular:  Antilipid - Statins Failed - 06/03/2024  5:43 PM      Failed - Lipid Panel in normal range within the last 12 months    Cholesterol, Total  Date Value Ref Range Status  02/05/2023 108 100 - 199 mg/dL Final   Cholesterol  Date Value Ref Range Status  04/25/2012 162 0 - 200 mg/dL Final   Ldl Cholesterol, Calc  Date Value Ref Range Status  04/25/2012 113 (H) 0 - 100 mg/dL Final   LDL Chol Calc (NIH)  Date Value Ref Range Status  02/05/2023 44 0 - 99 mg/dL Final   HDL Cholesterol  Date Value Ref Range Status  04/25/2012 30 (L) 40 - 60 mg/dL Final   HDL  Date Value Ref Range Status  02/05/2023 52 >39 mg/dL Final   Triglycerides  Date Value Ref Range Status  02/05/2023 52 0 - 149 mg/dL Final  87/73/7986 95 0 - 200 mg/dL Final         Passed - Patient is not pregnant      Passed - Valid encounter within last 12 months    Recent Outpatient Visits           10 months ago Primary hypertension   [facility], Leita DEL, MD
